# Patient Record
Sex: Male | Born: 1965 | Race: White | Hispanic: No | Marital: Married | Smoking: Former smoker
Health system: Southern US, Community
[De-identification: ages and names within clinical notes are randomized; demographics above are authoritative.]

## PROBLEM LIST (undated history)

## (undated) DIAGNOSIS — M549 Dorsalgia, unspecified: Secondary | ICD-10-CM

## (undated) DIAGNOSIS — I491 Atrial premature depolarization: Secondary | ICD-10-CM

## (undated) DIAGNOSIS — I493 Ventricular premature depolarization: Secondary | ICD-10-CM

## (undated) DIAGNOSIS — I471 Supraventricular tachycardia, unspecified: Secondary | ICD-10-CM

## (undated) DIAGNOSIS — I2510 Atherosclerotic heart disease of native coronary artery without angina pectoris: Secondary | ICD-10-CM

## (undated) HISTORY — DX: Supraventricular tachycardia, unspecified: I47.10

## (undated) HISTORY — DX: Atherosclerotic heart disease of native coronary artery without angina pectoris: I25.10

## (undated) HISTORY — DX: Atrial premature depolarization: I49.1

## (undated) HISTORY — DX: Ventricular premature depolarization: I49.3

## (undated) HISTORY — PX: BACK SURGERY: SHX140

## (undated) HISTORY — DX: Supraventricular tachycardia: I47.1

---

## 1999-02-24 ENCOUNTER — Encounter: Payer: Self-pay | Admitting: *Deleted

## 1999-02-24 ENCOUNTER — Inpatient Hospital Stay (HOSPITAL_COMMUNITY): Admission: EM | Admit: 1999-02-24 | Discharge: 1999-02-26 | Payer: Self-pay | Admitting: Emergency Medicine

## 1999-02-25 ENCOUNTER — Encounter: Payer: Self-pay | Admitting: *Deleted

## 2007-04-19 ENCOUNTER — Ambulatory Visit: Payer: Self-pay

## 2007-12-17 ENCOUNTER — Ambulatory Visit (HOSPITAL_COMMUNITY): Admission: RE | Admit: 2007-12-17 | Discharge: 2007-12-18 | Payer: Self-pay | Admitting: Neurosurgery

## 2008-04-28 ENCOUNTER — Ambulatory Visit: Admission: RE | Admit: 2008-04-28 | Discharge: 2008-04-28 | Payer: Self-pay | Admitting: Family Medicine

## 2008-04-28 ENCOUNTER — Ambulatory Visit: Payer: Self-pay | Admitting: Vascular Surgery

## 2008-04-28 ENCOUNTER — Encounter (INDEPENDENT_AMBULATORY_CARE_PROVIDER_SITE_OTHER): Payer: Self-pay | Admitting: Family Medicine

## 2009-10-27 ENCOUNTER — Encounter: Admission: RE | Admit: 2009-10-27 | Discharge: 2009-10-27 | Payer: Self-pay | Admitting: Neurosurgery

## 2009-10-29 ENCOUNTER — Encounter: Admission: RE | Admit: 2009-10-29 | Discharge: 2009-10-29 | Payer: Self-pay | Admitting: Neurosurgery

## 2009-11-19 ENCOUNTER — Encounter: Admission: RE | Admit: 2009-11-19 | Discharge: 2009-11-19 | Payer: Self-pay | Admitting: Neurosurgery

## 2010-01-19 ENCOUNTER — Ambulatory Visit (HOSPITAL_COMMUNITY): Admission: RE | Admit: 2010-01-19 | Discharge: 2010-01-20 | Payer: Self-pay | Admitting: Neurosurgery

## 2010-03-20 ENCOUNTER — Encounter: Payer: Self-pay | Admitting: Family Medicine

## 2010-05-11 LAB — BASIC METABOLIC PANEL
BUN: 12 mg/dL (ref 6–23)
Chloride: 105 mEq/L (ref 96–112)
GFR calc non Af Amer: >60 mL/min (ref >60)
Glucose, Bld: 100 mg/dL — ABNORMAL HIGH (ref 70–99)
Potassium: 4.3 mEq/L (ref 3.5–5.1)
Sodium: 140 mEq/L (ref 135–145)

## 2010-05-11 LAB — CBC
HCT: 45.5 % (ref 39.0–52.0)
Hemoglobin: 16.3 g/dL (ref 13.0–17.0)
MCHC: 35.8 g/dL (ref 30.0–36.0)
MCV: 94.4 fL (ref 78.0–100.0)
RDW: 12.2 % (ref 11.5–15.5)

## 2010-05-11 LAB — SURGICAL PCR SCREEN: MRSA, PCR: NEGATIVE

## 2010-07-12 NOTE — H&P (Signed)
Marvin Velasquez, Marvin Velasquez NO.:  [id number]   MEDICAL RECORD NO.:  [id number]          PATIENT TYPE:  OIB   LOCATION:  3537                         FACILITY:  [facility]   PHYSICIAN:  Hilda Lias, M.D.   DATE OF BIRTH:  [birth year]   DATE OF ADMISSION:  12/17/2007  DATE OF DISCHARGE:                              HISTORY & PHYSICAL   HISTORY OF PRESENT ILLNESS:  Marvin Velasquez is a gentleman who was seen in my  office on several occasions because of neck pain with radiation to both  upper extremities, associated with a tingling sensation of  4-5.  X-ray  showed that he has had stenosis of the level of 5-6. He came back to see  me because he was not any better.  He came with his wife and he wanted  to proceed with surgery as soon as possible.   PAST MEDICAL HISTORY:  Negative.   MEDICATIONS:  Negative.   FAMILY HISTORY:  Both parents died of heart disease.   SOCIAL HISTORY:  He does not smoke.  He drinks socially.   REVIEW OF SYSTEMS:  Positive for neck pain and tingling sensation in  both hands.   PHYSICAL EXAMINATION:  HEAD, EYES, NOSE, AND THROAT:  Normal.  NECK:  He has had severe pain with mobilization.  LUNGS:  Clear.  HEART:  Sounds normal.  ABDOMEN:  Normal.  EXTREMITIES:  Normal pulse.  NEUROLOGIC:  Weakness of both biceps __________extension, weakness is  symmetrical.   Cervical MRI show stenosis  at the level of 5-6 with bilateral  radiculopathy.   CLINICAL IMPRESSION:  C5-C6 stenosis with bilateral C6 radiculopathy.   RECOMMENDATIONS:  The patient is being admitted for surgery.  The  procedure will be C5-C6 diskectomy and fusion.  The patient knows about  the risks such as infection, CSF leak, no healing, need of further  surgery, spinal cord damage, or stroke.           ______________________________  Hilda Lias, M.D.     EB/MEDQ  D:  12/17/2007  T:  12/18/2007  Job:  [id number]

## 2010-07-12 NOTE — Op Note (Signed)
Marvin Velasquez, Marvin Velasquez                  ACCOUNT NO.:  [id number]   MEDICAL RECORD NO.:  [id number]          PATIENT TYPE:  OIB   LOCATION:  3537                         FACILITY:  [facility]   PHYSICIAN:  Hilda Lias, M.D.   DATE OF BIRTH:  Nov 30, 1965   DATE OF PROCEDURE:  DATE OF DISCHARGE:                               OPERATIVE REPORT   PREOPERATIVE DIAGNOSIS:  C5-C6 stenosis with lumbar radiculopathy.   POSTOPERATIVE DIAGNOSIS:  C5-C6 stenosis with lumbar radiculopathy.   PROCEDURE:  Anterior 5-6 diskectomy, interbody fusion with auto and  allograft plate microscope.   SURGEON:  Hilda Lias, MD   ASSISTANT:  Coletta Memos, MD.   CLINICAL HISTORY:  Marvin Velasquez is a 45 year old gentleman complaining of  neck pain, tingling sensation, weakness in both upper extremities.  X-  ray shows severe stenosis of 5-6.  Surgery was advised.   PROCEDURE:  The patient was taken to the OR and after intubation, the  left side of the neck was cleaned with DuraPrep.  Transverse incision  was made through the skin and subcutaneous tissue down to the cervical  spine.  We found a light osteophyte which was removed.  X-ray showed  indeed that was 5-6.  Then, we brought the microscope into the area.  The disk space was quite narrow, almost with no disk whatsoever.  We had  to drill our way until the posterior aspect of vertebral body.  Then,  the anterior ligament which was calcified was removed after incision was  made in the midline.  Having done this, we decompressed the spinal cord,  removing part of the bone of the 5-6.  It was quite narrowed.  The right  side was worse than the left one.  At the end, we had good decompression  of not only the spinal cord but both C6 nerve root.  Then the endplate  was drilled.  Allograft of 8 mm with autograft inside was inserted  followed by a plate using 4 screws.  Lateral cervical spine showed good  position of the graft and the plate.  The area was irrigated.   Once we  achieved hemostasis, the wound was closed with Vicryl and Steri-Strips.           ______________________________  Hilda Lias, M.D.     EB/MEDQ  D:  12/17/2007  T:  12/18/2007  Job:  [id number]

## 2010-11-29 LAB — CBC
HCT: 45.5
Hemoglobin: 15.6
MCV: 96.1
WBC: 6.3

## 2015-08-16 ENCOUNTER — Ambulatory Visit
Admission: RE | Admit: 2015-08-16 | Discharge: 2015-08-16 | Disposition: A | Discharge status: Home / Self Care | Payer: BC Managed Care – PPO | Source: Ambulatory Visit | Attending: Family Medicine | Admitting: Family Medicine

## 2015-08-16 ENCOUNTER — Other Ambulatory Visit: Payer: Self-pay | Admitting: Family Medicine

## 2015-08-16 DIAGNOSIS — M25511 Pain in right shoulder: Secondary | ICD-10-CM

## 2015-08-17 ENCOUNTER — Other Ambulatory Visit: Payer: Self-pay | Admitting: Family Medicine

## 2015-08-17 ENCOUNTER — Ambulatory Visit
Admission: RE | Admit: 2015-08-17 | Discharge: 2015-08-17 | Disposition: A | Discharge status: Home / Self Care | Payer: BC Managed Care – PPO | Source: Ambulatory Visit | Attending: Family Medicine | Admitting: Family Medicine

## 2015-08-17 DIAGNOSIS — M25551 Pain in right hip: Secondary | ICD-10-CM

## 2016-09-06 IMAGING — CR DG HIP (WITH OR WITHOUT PELVIS) 2-3V*R*
2 series · 2 of 2 positions shown · non-contrast
Comparison: None.

CLINICAL DATA: Right hip and buttock pain after sitting

EXAM:
DG HIP (WITH OR WITHOUT PELVIS) 2-3V RIGHT

[w hip ap right]
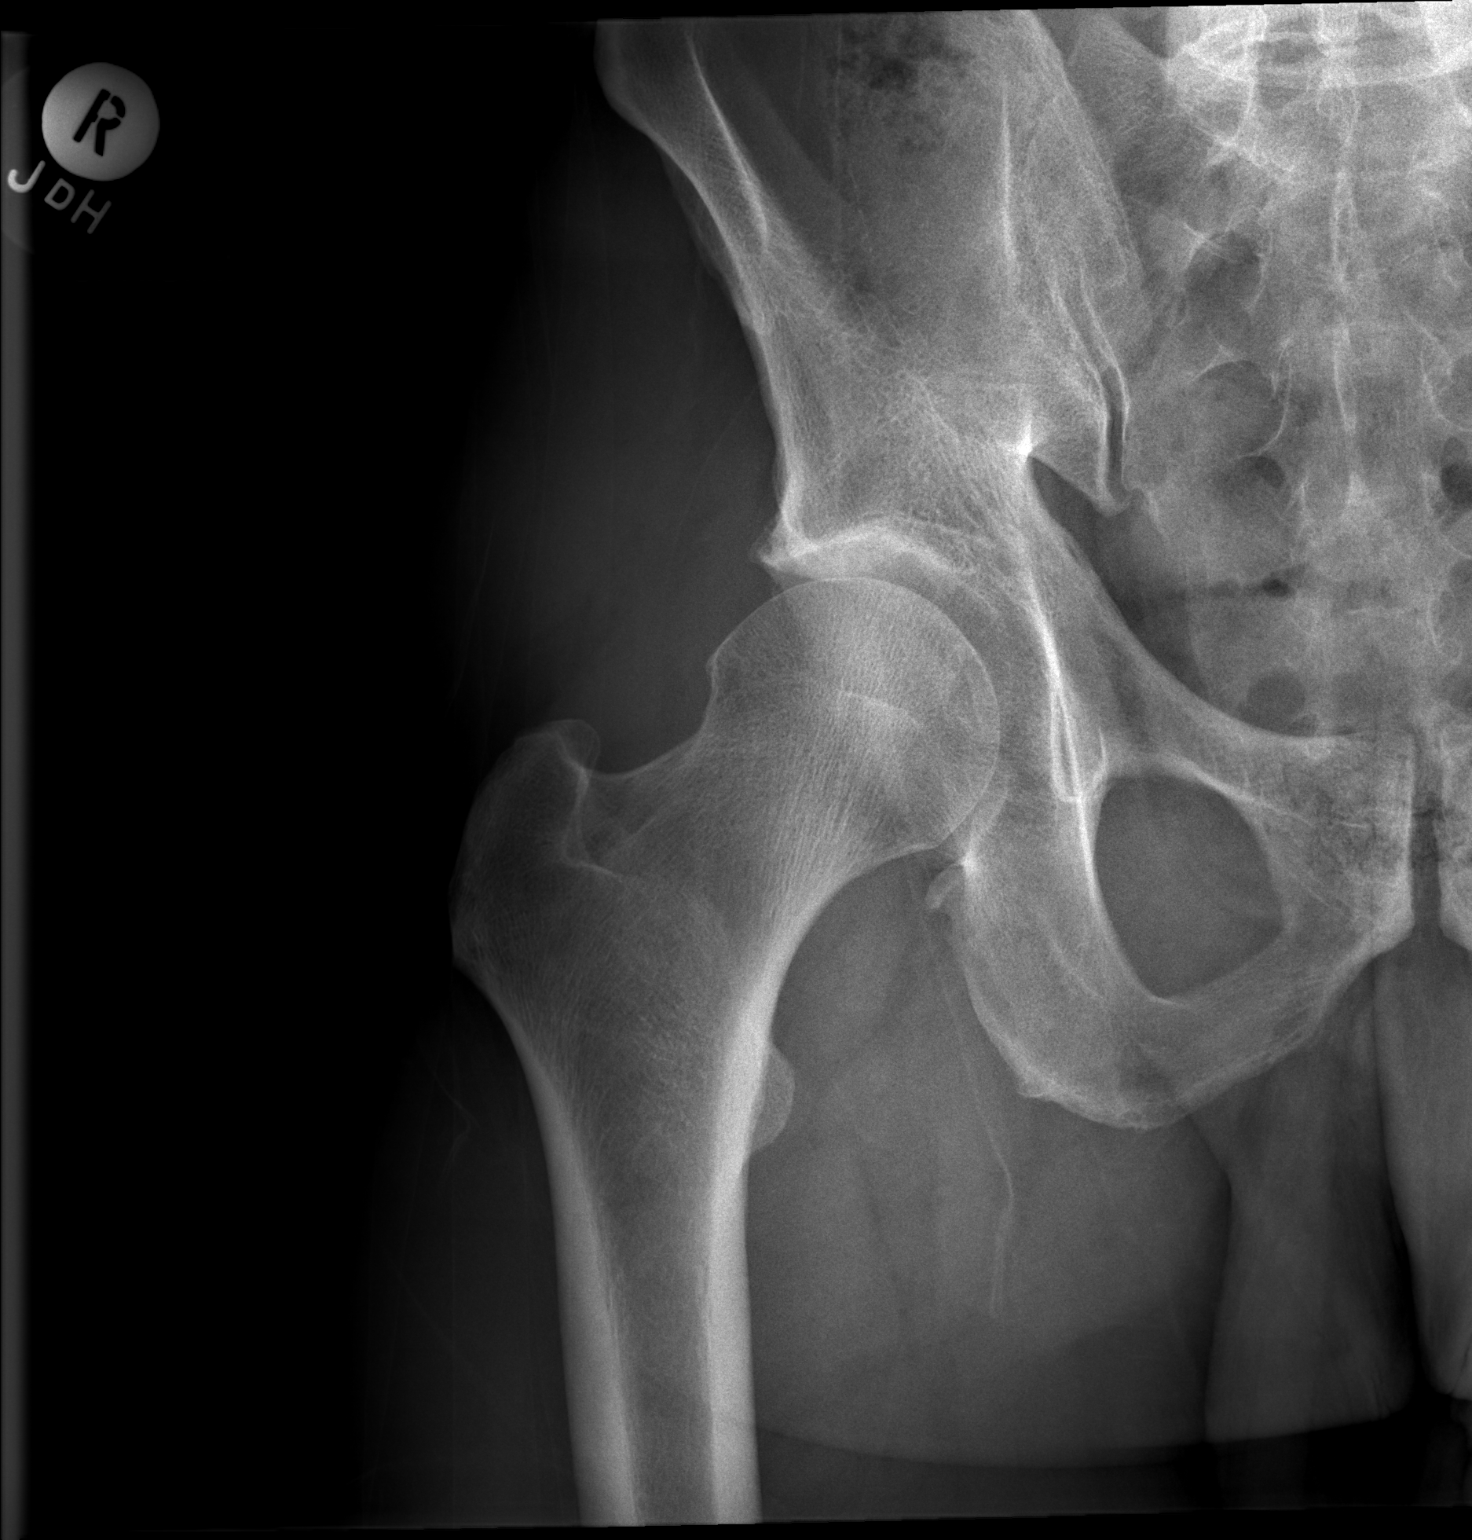

[w hip frog right]
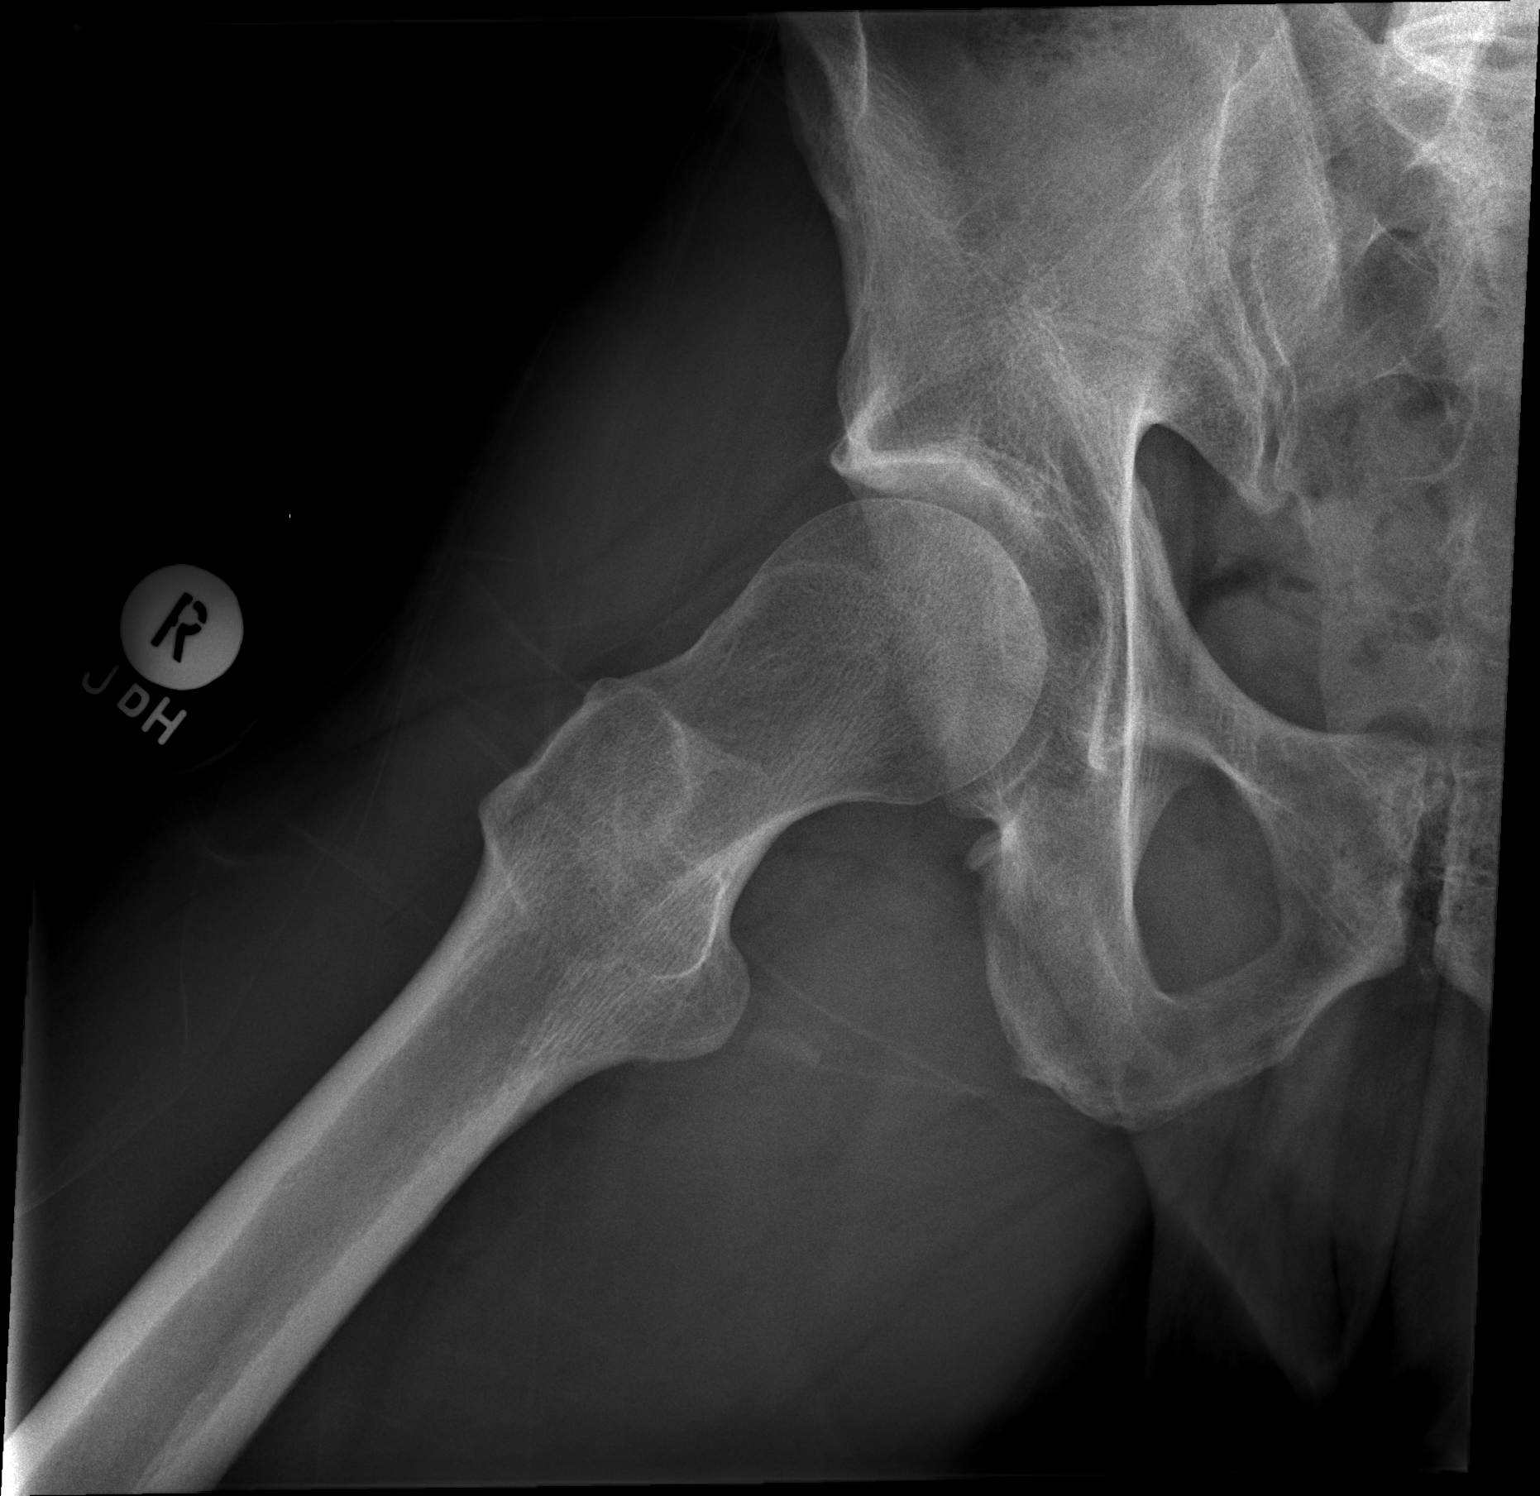

[2 of 2 positions shown; findings below may reference images not displayed]

FINDINGS: The right hip joint space appears normal. No significant
degenerative change is seen. No fracture is noted. The right pelvic
ramus is intact. The right SI joint appears corticated.
IMPRESSION: Negative.

## 2018-09-25 DIAGNOSIS — Z13220 Encounter for screening for lipoid disorders: Secondary | ICD-10-CM | POA: Diagnosis not present

## 2018-09-25 DIAGNOSIS — D367 Benign neoplasm of other specified sites: Secondary | ICD-10-CM | POA: Diagnosis not present

## 2018-09-25 DIAGNOSIS — Z8249 Family history of ischemic heart disease and other diseases of the circulatory system: Secondary | ICD-10-CM | POA: Diagnosis not present

## 2018-09-25 DIAGNOSIS — M7712 Lateral epicondylitis, left elbow: Secondary | ICD-10-CM | POA: Diagnosis not present

## 2018-09-25 DIAGNOSIS — Z0000 Encounter for general adult medical examination without abnormal findings: Secondary | ICD-10-CM | POA: Diagnosis not present

## 2018-09-25 DIAGNOSIS — C189 Malignant neoplasm of colon, unspecified: Secondary | ICD-10-CM | POA: Diagnosis not present

## 2018-09-25 DIAGNOSIS — R079 Chest pain, unspecified: Secondary | ICD-10-CM | POA: Diagnosis not present

## 2018-09-25 DIAGNOSIS — R5382 Chronic fatigue, unspecified: Secondary | ICD-10-CM | POA: Diagnosis not present

## 2018-09-25 DIAGNOSIS — Z1329 Encounter for screening for other suspected endocrine disorder: Secondary | ICD-10-CM | POA: Diagnosis not present

## 2018-09-27 DIAGNOSIS — M79601 Pain in right arm: Secondary | ICD-10-CM | POA: Diagnosis not present

## 2018-09-27 DIAGNOSIS — R079 Chest pain, unspecified: Secondary | ICD-10-CM | POA: Diagnosis not present

## 2018-09-27 DIAGNOSIS — C189 Malignant neoplasm of colon, unspecified: Secondary | ICD-10-CM | POA: Diagnosis not present

## 2018-09-27 DIAGNOSIS — M79602 Pain in left arm: Secondary | ICD-10-CM | POA: Diagnosis not present

## 2018-10-01 DIAGNOSIS — M79602 Pain in left arm: Secondary | ICD-10-CM | POA: Diagnosis not present

## 2018-10-01 DIAGNOSIS — M79601 Pain in right arm: Secondary | ICD-10-CM | POA: Diagnosis not present

## 2018-10-01 DIAGNOSIS — R079 Chest pain, unspecified: Secondary | ICD-10-CM | POA: Diagnosis not present

## 2018-10-04 DIAGNOSIS — R7989 Other specified abnormal findings of blood chemistry: Secondary | ICD-10-CM | POA: Diagnosis not present

## 2018-10-04 DIAGNOSIS — R079 Chest pain, unspecified: Secondary | ICD-10-CM | POA: Diagnosis not present

## 2018-10-15 DIAGNOSIS — I517 Cardiomegaly: Secondary | ICD-10-CM | POA: Diagnosis not present

## 2018-11-11 DIAGNOSIS — Z86010 Personal history of colonic polyps: Secondary | ICD-10-CM | POA: Diagnosis not present

## 2018-11-19 DIAGNOSIS — C189 Malignant neoplasm of colon, unspecified: Secondary | ICD-10-CM | POA: Diagnosis not present

## 2018-11-19 DIAGNOSIS — R079 Chest pain, unspecified: Secondary | ICD-10-CM | POA: Diagnosis not present

## 2019-05-02 ENCOUNTER — Ambulatory Visit: Payer: Self-pay | Attending: Internal Medicine

## 2019-05-02 DIAGNOSIS — Z23 Encounter for immunization: Secondary | ICD-10-CM | POA: Insufficient documentation

## 2019-05-02 NOTE — Progress Notes (Signed)
   Covid-19 Vaccination Clinic  Name:  Marvin Velasquez    MRN: [id number] DOB: 1965-03-30  05/02/2019  Mr. Marvin Velasquez was observed post Covid-19 immunization for 15 minutes without incident. He was provided with Vaccine Information Sheet and instruction to access the V-Safe system.   Mr. Marvin Velasquez was instructed to call 911 with any severe reactions post vaccine: Marland Kitchen Difficulty breathing  . Swelling of face and throat  . A fast heartbeat  . A bad rash all over body  . Dizziness and weakness   Immunizations Administered    Name Date Dose VIS Date Route   Pfizer COVID-19 Vaccine 05/02/2019 11:25 AM 0.3 mL 02/07/2019 Intramuscular   Manufacturer: [employer], [employer]   Lot: [id number]   NDC: [address]-4

## 2019-05-28 ENCOUNTER — Ambulatory Visit: Payer: Self-pay | Attending: Internal Medicine

## 2019-05-28 DIAGNOSIS — Z23 Encounter for immunization: Secondary | ICD-10-CM

## 2019-05-28 NOTE — Progress Notes (Signed)
   Covid-19 Vaccination Clinic  Name:  Marvin Velasquez    MRN: [id number] DOB: 15-Feb-1966  05/28/2019  Mr. Marvin Velasquez was observed post Covid-19 immunization for 15 minutes without incident. He was provided with Vaccine Information Sheet and instruction to access the V-Safe system.   Mr. Marvin Velasquez was instructed to call 911 with any severe reactions post vaccine: Marland Kitchen Difficulty breathing  . Swelling of face and throat  . A fast heartbeat  . A bad rash all over body  . Dizziness and weakness   Immunizations Administered    Name Date Dose VIS Date Route   Pfizer COVID-19 Vaccine 05/28/2019 11:25 AM 0.3 mL 02/07/2019 Intramuscular   Manufacturer: [employer], [employer]   Lot: [id number]   NDC: [address]-2

## 2019-12-25 ENCOUNTER — Inpatient Hospital Stay (HOSPITAL_COMMUNITY)
Admission: EM | Admit: 2019-12-25 | Discharge: 2019-12-28 | DRG: 247 | Disposition: A | Discharge status: Home / Self Care | Payer: BC Managed Care – PPO | Attending: Cardiovascular Disease | Admitting: Cardiovascular Disease

## 2019-12-25 ENCOUNTER — Inpatient Hospital Stay (HOSPITAL_COMMUNITY): Payer: BC Managed Care – PPO

## 2019-12-25 ENCOUNTER — Inpatient Hospital Stay (HOSPITAL_COMMUNITY)
Admission: EM | Disposition: A | Discharge status: Home / Self Care | Payer: Self-pay | Source: Home / Self Care | Attending: Cardiovascular Disease

## 2019-12-25 ENCOUNTER — Encounter (HOSPITAL_COMMUNITY): Payer: Self-pay | Admitting: Cardiovascular Disease

## 2019-12-25 ENCOUNTER — Emergency Department (HOSPITAL_COMMUNITY): Payer: BC Managed Care – PPO

## 2019-12-25 DIAGNOSIS — R079 Chest pain, unspecified: Secondary | ICD-10-CM | POA: Diagnosis not present

## 2019-12-25 DIAGNOSIS — R0789 Other chest pain: Secondary | ICD-10-CM | POA: Insufficient documentation

## 2019-12-25 DIAGNOSIS — Z20822 Contact with and (suspected) exposure to covid-19: Secondary | ICD-10-CM | POA: Diagnosis not present

## 2019-12-25 DIAGNOSIS — Z8249 Family history of ischemic heart disease and other diseases of the circulatory system: Secondary | ICD-10-CM

## 2019-12-25 DIAGNOSIS — I25110 Atherosclerotic heart disease of native coronary artery with unstable angina pectoris: Secondary | ICD-10-CM

## 2019-12-25 DIAGNOSIS — E7800 Pure hypercholesterolemia, unspecified: Secondary | ICD-10-CM | POA: Diagnosis present

## 2019-12-25 DIAGNOSIS — R001 Bradycardia, unspecified: Secondary | ICD-10-CM | POA: Diagnosis not present

## 2019-12-25 DIAGNOSIS — Z23 Encounter for immunization: Secondary | ICD-10-CM | POA: Diagnosis not present

## 2019-12-25 DIAGNOSIS — R21 Rash and other nonspecific skin eruption: Secondary | ICD-10-CM | POA: Diagnosis not present

## 2019-12-25 DIAGNOSIS — R072 Precordial pain: Secondary | ICD-10-CM | POA: Diagnosis not present

## 2019-12-25 DIAGNOSIS — I213 ST elevation (STEMI) myocardial infarction of unspecified site: Secondary | ICD-10-CM | POA: Diagnosis not present

## 2019-12-25 DIAGNOSIS — I2119 ST elevation (STEMI) myocardial infarction involving other coronary artery of inferior wall: Principal | ICD-10-CM

## 2019-12-25 DIAGNOSIS — I2111 ST elevation (STEMI) myocardial infarction involving right coronary artery: Secondary | ICD-10-CM | POA: Diagnosis not present

## 2019-12-25 DIAGNOSIS — Z955 Presence of coronary angioplasty implant and graft: Secondary | ICD-10-CM

## 2019-12-25 DIAGNOSIS — I25119 Atherosclerotic heart disease of native coronary artery with unspecified angina pectoris: Secondary | ICD-10-CM

## 2019-12-25 DIAGNOSIS — I2510 Atherosclerotic heart disease of native coronary artery without angina pectoris: Secondary | ICD-10-CM

## 2019-12-25 DIAGNOSIS — E785 Hyperlipidemia, unspecified: Secondary | ICD-10-CM | POA: Diagnosis not present

## 2019-12-25 HISTORY — DX: Dorsalgia, unspecified: M54.9

## 2019-12-25 HISTORY — PX: CORONARY/GRAFT ACUTE MI REVASCULARIZATION: CATH118305

## 2019-12-25 LAB — LIPID PANEL
Cholesterol: 182 mg/dL (ref 0–200)
HDL: 34 mg/dL — ABNORMAL LOW (ref >40)
LDL Cholesterol: 115 mg/dL — ABNORMAL HIGH (ref 0–99)
Total CHOL/HDL Ratio: 5.4 RATIO
Triglycerides: 164 mg/dL — ABNORMAL HIGH (ref <150)
VLDL: 33 mg/dL (ref 0–40)

## 2019-12-25 LAB — COMPREHENSIVE METABOLIC PANEL
ALT: 23 U/L (ref 0–44)
AST: 28 U/L (ref 15–41)
Albumin: 3.6 g/dL (ref 3.5–5.0)
Alkaline Phosphatase: 50 U/L (ref 38–126)
Anion gap: 9 (ref 5–15)
BUN: 21 mg/dL — ABNORMAL HIGH (ref 6–20)
CO2: 22 mmol/L (ref 22–32)
Calcium: 7.9 mg/dL — ABNORMAL LOW (ref 8.9–10.3)
Chloride: 97 mmol/L — ABNORMAL LOW (ref 98–111)
Creatinine, Ser: 0.83 mg/dL (ref 0.61–1.24)
GFR, Estimated: >60 mL/min (ref >60)
Glucose, Bld: 147 mg/dL — ABNORMAL HIGH (ref 70–99)
Potassium: 3.3 mmol/L — ABNORMAL LOW (ref 3.5–5.1)
Sodium: 128 mmol/L — ABNORMAL LOW (ref 135–145)
Total Bilirubin: 0.6 mg/dL (ref 0.3–1.2)
Total Protein: 5.9 g/dL — ABNORMAL LOW (ref 6.5–8.1)

## 2019-12-25 LAB — CBC WITH DIFFERENTIAL/PLATELET
Abs Immature Granulocytes: 0.03 10*3/uL (ref 0.00–0.07)
Basophils Absolute: 0 10*3/uL (ref 0.0–0.1)
Basophils Relative: 1 %
Eosinophils Absolute: 0.1 10*3/uL (ref 0.0–0.5)
Eosinophils Relative: 1 %
HCT: 40.3 % (ref 39.0–52.0)
Hemoglobin: 14.3 g/dL (ref 13.0–17.0)
Immature Granulocytes: 0 %
Lymphocytes Relative: 11 %
Lymphs Abs: 1 10*3/uL (ref 0.7–4.0)
MCH: 32.4 pg (ref 26.0–34.0)
MCHC: 35.5 g/dL (ref 30.0–36.0)
MCV: 91.4 fL (ref 80.0–100.0)
Monocytes Absolute: 0.7 10*3/uL (ref 0.1–1.0)
Monocytes Relative: 7 %
Neutro Abs: 7 10*3/uL (ref 1.7–7.7)
Neutrophils Relative %: 80 %
Platelets: 185 10*3/uL (ref 150–400)
RBC: 4.41 MIL/uL (ref 4.22–5.81)
RDW: 11.9 % (ref 11.5–15.5)
WBC: 8.8 10*3/uL (ref 4.0–10.5)
nRBC: 0 % (ref 0.0–0.2)

## 2019-12-25 LAB — TROPONIN I (HIGH SENSITIVITY)
Troponin I (High Sensitivity): 15 ng/L (ref <18)
Troponin I (High Sensitivity): 9333 ng/L (ref <18)
Troponin I (High Sensitivity): 24320 ng/L (ref <18)

## 2019-12-25 LAB — RESPIRATORY PANEL BY RT PCR (FLU A&B, COVID)
Influenza A by PCR: NEGATIVE
Influenza B by PCR: NEGATIVE
SARS Coronavirus 2 by RT PCR: NEGATIVE

## 2019-12-25 LAB — POCT ACTIVATED CLOTTING TIME: Activated Clotting Time: 120 seconds

## 2019-12-25 LAB — MRSA PCR SCREENING: MRSA by PCR: NEGATIVE

## 2019-12-25 LAB — PROTIME-INR
INR: 1.6 — ABNORMAL HIGH (ref 0.8–1.2)
Prothrombin Time: 18.1 seconds — ABNORMAL HIGH (ref 11.4–15.2)

## 2019-12-25 SURGERY — CORONARY/GRAFT ACUTE MI REVASCULARIZATION
Anesthesia: LOCAL

## 2019-12-25 MED ORDER — NITROGLYCERIN 1 MG/10 ML FOR IR/CATH LAB
INTRA_ARTERIAL | Status: AC
  Filled 2019-12-25: qty 10

## 2019-12-25 MED ORDER — HYDRALAZINE HCL 20 MG/ML IJ SOLN: 10.0000 mg | INTRAMUSCULAR | Status: AC | PRN

## 2019-12-25 MED ORDER — VERAPAMIL HCL 2.5 MG/ML IV SOLN: INTRAVENOUS | Status: DC | PRN

## 2019-12-25 MED ORDER — HEPARIN SODIUM (PORCINE) 1000 UNIT/ML IJ SOLN
INTRAMUSCULAR | Status: DC | PRN
  Administered 2019-12-25: 6000 [IU] via INTRAVENOUS

## 2019-12-25 MED ORDER — ATROPINE SULFATE 1 MG/10ML IJ SOSY
PREFILLED_SYRINGE | INTRAMUSCULAR | Status: AC
  Filled 2019-12-25: qty 10

## 2019-12-25 MED ORDER — MIDAZOLAM HCL 2 MG/2ML IJ SOLN
INTRAMUSCULAR | Status: DC | PRN
  Administered 2019-12-25: 2 mg via INTRAVENOUS

## 2019-12-25 MED ORDER — MORPHINE SULFATE (PF) 4 MG/ML IV SOLN: 4.0000 mg | Freq: Once | INTRAVENOUS | Status: DC

## 2019-12-25 MED ORDER — LIDOCAINE HCL (PF) 1 % IJ SOLN
INTRAMUSCULAR | Status: DC | PRN
  Administered 2019-12-25: 15 mL
  Administered 2019-12-25: 2 mL

## 2019-12-25 MED ORDER — SODIUM CHLORIDE 0.9% FLUSH: 3.0000 mL | INTRAVENOUS | Status: DC | PRN

## 2019-12-25 MED ORDER — ACETAMINOPHEN 325 MG PO TABS
650.0000 mg | ORAL_TABLET | ORAL | Status: DC | PRN
  Administered 2019-12-25 – 2019-12-28 (×5): 650 mg via ORAL
  Filled 2019-12-25 (×5): qty 2

## 2019-12-25 MED ORDER — HEPARIN (PORCINE) IN NACL 1000-0.9 UT/500ML-% IV SOLN
INTRAVENOUS | Status: DC | PRN
  Administered 2019-12-25 (×2): 500 mL

## 2019-12-25 MED ORDER — IOHEXOL 350 MG/ML SOLN
INTRAVENOUS | Status: DC | PRN
  Administered 2019-12-25 (×2): 90 mL

## 2019-12-25 MED ORDER — FENTANYL CITRATE (PF) 100 MCG/2ML IJ SOLN
INTRAMUSCULAR | Status: DC | PRN
Start: 2019-12-25 — End: 2019-12-25
  Administered 2019-12-25 (×2): 50 ug via INTRAVENOUS

## 2019-12-25 MED ORDER — TIROFIBAN (AGGRASTAT) BOLUS VIA INFUSION
INTRAVENOUS | Status: DC | PRN
  Administered 2019-12-25: 1870 ug via INTRAVENOUS

## 2019-12-25 MED ORDER — MORPHINE SULFATE (PF) 2 MG/ML IV SOLN: 2.0000 mg | INTRAVENOUS | Status: DC | PRN

## 2019-12-25 MED ORDER — ONDANSETRON HCL 4 MG/2ML IJ SOLN: 4.0000 mg | Freq: Once | INTRAMUSCULAR | Status: DC

## 2019-12-25 MED ORDER — ATROPINE SULFATE 1 MG/10ML IJ SOSY
PREFILLED_SYRINGE | INTRAMUSCULAR | Status: DC | PRN
  Administered 2019-12-25: 0.5 mg via INTRAVENOUS

## 2019-12-25 MED ORDER — TICAGRELOR 90 MG PO TABS
ORAL_TABLET | ORAL | Status: DC | PRN
  Administered 2019-12-25: 180 mg via ORAL

## 2019-12-25 MED ORDER — INFLUENZA VAC SPLIT QUAD 0.5 ML IM SUSY
0.5000 mL | PREFILLED_SYRINGE | INTRAMUSCULAR | Status: AC
  Administered 2019-12-27: 0.5 mL via INTRAMUSCULAR
  Filled 2019-12-25: qty 0.5

## 2019-12-25 MED ORDER — HEPARIN SODIUM (PORCINE) 5000 UNIT/ML IJ SOLN: 4000.0000 [IU] | Freq: Once | INTRAMUSCULAR | Status: DC

## 2019-12-25 MED ORDER — LABETALOL HCL 5 MG/ML IV SOLN: 10.0000 mg | INTRAVENOUS | Status: AC | PRN

## 2019-12-25 MED ORDER — TICAGRELOR 90 MG PO TABS
90.0000 mg | ORAL_TABLET | Freq: Two times a day (BID) | ORAL | Status: DC
  Administered 2019-12-26 – 2019-12-28 (×6): 90 mg via ORAL
  Filled 2019-12-25 (×6): qty 1

## 2019-12-25 MED ORDER — SODIUM CHLORIDE 0.9 % IV SOLN: INTRAVENOUS | Status: DC

## 2019-12-25 MED ORDER — POTASSIUM CHLORIDE 20 MEQ/15ML (10%) PO SOLN
40.0000 meq | Freq: Once | ORAL | Status: AC
  Administered 2019-12-25: 40 meq via ORAL
  Filled 2019-12-25: qty 30

## 2019-12-25 MED ORDER — SODIUM CHLORIDE 0.9 % IV SOLN: INTRAVENOUS | Status: AC

## 2019-12-25 MED ORDER — SODIUM CHLORIDE 0.9% FLUSH
3.0000 mL | Freq: Two times a day (BID) | INTRAVENOUS | Status: DC
  Administered 2019-12-25 – 2019-12-27 (×4): 3 mL via INTRAVENOUS

## 2019-12-25 MED ORDER — ASPIRIN 81 MG PO CHEW
81.0000 mg | CHEWABLE_TABLET | Freq: Every day | ORAL | Status: DC
  Administered 2019-12-26 – 2019-12-28 (×3): 81 mg via ORAL
  Filled 2019-12-25 (×3): qty 1

## 2019-12-25 MED ORDER — TIROFIBAN HCL IN NACL 5-0.9 MG/100ML-% IV SOLN: 0.1500 ug/kg/min | INTRAVENOUS | Status: AC

## 2019-12-25 MED ORDER — ONDANSETRON HCL 4 MG/2ML IJ SOLN: 4.0000 mg | Freq: Four times a day (QID) | INTRAMUSCULAR | Status: DC | PRN

## 2019-12-25 MED ORDER — OXYCODONE HCL 5 MG PO TABS
5.0000 mg | ORAL_TABLET | ORAL | Status: DC | PRN
  Administered 2019-12-26: 10 mg via ORAL
  Filled 2019-12-25: qty 2

## 2019-12-25 MED ORDER — ATORVASTATIN CALCIUM 80 MG PO TABS
80.0000 mg | ORAL_TABLET | Freq: Every day | ORAL | Status: DC
  Administered 2019-12-25 – 2019-12-27 (×3): 80 mg via ORAL
  Filled 2019-12-25 (×3): qty 1

## 2019-12-25 MED ORDER — SODIUM CHLORIDE 0.9 % IV SOLN: 250.0000 mL | INTRAVENOUS | Status: DC | PRN

## 2019-12-25 MED ORDER — TIROFIBAN HCL IN NACL 5-0.9 MG/100ML-% IV SOLN
INTRAVENOUS | Status: AC | PRN
  Administered 2019-12-25 (×2): 0.15 ug/kg/min via INTRAVENOUS

## 2019-12-25 SURGICAL SUPPLY — 18 items
BALLN SAPPHIRE 2.0X12 (BALLOONS) ×2
BALLN SAPPHIRE ~~LOC~~ 3.25X12 (BALLOONS) ×2 IMPLANT
BALLOON SAPPHIRE 2.0X12 (BALLOONS) ×1 IMPLANT
CATH INFINITI 5FR MULTPACK ANG (CATHETERS) ×2 IMPLANT
CATH LAUNCHER 6FR JR4 (CATHETERS) ×2 IMPLANT
GLIDESHEATH SLEND SS 6F .021 (SHEATH) ×2 IMPLANT
GUIDEWIRE INQWIRE 1.5J.035X260 (WIRE) ×1 IMPLANT
INQWIRE 1.5J .035X260CM (WIRE) ×2
KIT ENCORE 26 ADVANTAGE (KITS) ×2 IMPLANT
KIT HEART LEFT (KITS) ×2 IMPLANT
PACK CARDIAC CATHETERIZATION (CUSTOM PROCEDURE TRAY) ×2 IMPLANT
SHEATH PINNACLE 6F 10CM (SHEATH) ×2 IMPLANT
STENT SYNERGY XD 3.0X16 (Permanent Stent) ×1 IMPLANT
SYNERGY XD 3.0X16 (Permanent Stent) ×2 IMPLANT
TRANSDUCER W/STOPCOCK (MISCELLANEOUS) ×2 IMPLANT
TUBING CIL FLEX 10 FLL-RA (TUBING) ×2 IMPLANT
WIRE COUGAR XT STRL 190CM (WIRE) ×2 IMPLANT
WIRE EMERALD 3MM-J .035X150CM (WIRE) ×2 IMPLANT

## 2019-12-25 NOTE — Progress Notes (Signed)
Responded to code stemi. Pt in trauma B.  Going strait to [employer]. Called wife several times at [phone]. No answer.

## 2019-12-25 NOTE — H&P (Signed)
Cardiology Admission History and Physical:   Patient ID: Marvin Velasquez MRN: [id number]; DOB: 18-Aug-1965   Admission date: 12/25/2019  Primary Care Provider: Patient, No Pcp Per [facility] Cardiologist: No primary care provider on file.   Chief Complaint:  Chest pain   History of Present Illness:   Marvin Velasquez is a 54 yo male with no chronic medical problems who presented to the ED today via EMS with c/o severe substernal chest pain. He is diaphoretic upon arrival with ongoing chest pain. EKG with inferior ST elevation. Code STEMI activated.    Past Medical History:  Diagnosis Date  . Back pain     Past Surgical History:  Procedure Laterality Date  . BACK SURGERY       Medications Prior to Admission: Prior to Admission medications   Not on File  No home medications  Allergies:   Not on File  NKDA  Social History:   Social History   Socioeconomic History  . Marital status: Married    Spouse name: Not on file  . Number of children: Not on file  . Years of education: Not on file  . Highest education level: Not on file  Occupational History  . Not on file  Tobacco Use  . Smoking status: Not on file  Substance and Sexual Activity  . Alcohol use: Not on file  . Drug use: Not on file  . Sexual activity: Not on file  Other Topics Concern  . Not on file  Social History Narrative  . Not on file   Social Determinants of Health   Financial Resource Strain:   . Difficulty of Paying Living Expenses: Not on file  Food Insecurity:   . Worried About Programme researcher, broadcasting/film/video in the Last Year: Not on file  . Ran Out of Food in the Last Year: Not on file  Transportation Needs:   . Lack of Transportation (Medical): Not on file  . Lack of Transportation (Non-Medical): Not on file  Physical Activity:   . Days of Exercise per Week: Not on file  . Minutes of Exercise per Session: Not on file  Stress:   . Feeling of Stress : Not on file  Social Connections:   . Frequency of  Communication with Friends and Family: Not on file  . Frequency of Social Gatherings with Friends and Family: Not on file  . Attends Religious Services: Not on file  . Active Member of Clubs or Organizations: Not on file  . Attends Banker Meetings: Not on file  . Marital Status: Not on file  Intimate Partner Violence:   . Fear of Current or Ex-Partner: Not on file  . Emotionally Abused: Not on file  . Physically Abused: Not on file  . Sexually Abused: Not on file    Family History:  Family history of CAD The patient's family history is not on file.    ROS:  Please see the history of present illness.  All other ROS reviewed and negative.     Physical Exam/Data:   Vitals:   12/25/19 1458 12/25/19 1503 12/25/19 1508 12/25/19 1513  BP: 132/89 124/78 124/70 127/76  Pulse: 79 87 82 71  Resp: 17 19 (!) 27 17  SpO2: 100% 100% 100% (!) 0%  Weight:      Height:       No intake or output data in the 24 hours ending 12/25/19 1540 Last 3 Weights 12/25/2019 12/25/2019  Weight (lbs) 165 lb 165 lb  Weight (kg) 74.844 kg 74.844 kg     Body mass index is 24.37 kg/m.  General:  Well nourished, well developed, ill appearing HEENT: normal Lymph: no adenopathy Neck: no JVD Endocrine:  No thryomegaly Vascular: No carotid bruits; FA pulses 2+ bilaterally without bruits  Cardiac:  normal S1, S2; RRR; no murmur  Lungs:  clear to auscultation bilaterally, no wheezing, rhonchi or rales  Abd: soft, nontender, no hepatomegaly  Ext: no LE edema Musculoskeletal:  No deformities, BUE and BLE strength normal and equal Skin: warm and dry  Neuro:  CNs 2-12 intact, no focal abnormalities noted Psych:  Normal affect    EKG:  The ECG that was done was personally reviewed and demonstrates sinus rhythm with inferoposterior ST elevation.   Relevant CV Studies:   Laboratory Data:  High Sensitivity Troponin:   Recent Labs  Lab 12/25/19 1450  TROPONINIHS 15      Chemistry Recent  Labs  Lab 12/25/19 1450  NA 128*  K 3.3*  CL 97*  CO2 22  GLUCOSE 147*  BUN 21*  CREATININE 0.83  CALCIUM 7.9*  GFRNONAA >60  ANIONGAP 9    Recent Labs  Lab 12/25/19 1450  PROT 5.9*  ALBUMIN 3.6  AST 28  ALT 23  ALKPHOS 50  BILITOT 0.6   Hematology Recent Labs  Lab 12/25/19 1450  WBC 8.8  RBC 4.41  HGB 14.3  HCT 40.3  MCV 91.4  MCH 32.4  MCHC 35.5  RDW 11.9  PLT 185   BNPNo results for input(s): BNP, PROBNP in the last 168 hours.  DDimer No results for input(s): DDIMER in the last 168 hours.   Radiology/Studies:  CARDIAC CATHETERIZATION  Result Date: 12/25/2019  Mid RCA lesion is 100% stenosed.  Prox Cx to Mid Cx lesion is 20% stenosed.  Mid LAD lesion is 80% stenosed.  A drug-eluting stent was successfully placed using a SYNERGY XD 3.0X16.  Post intervention, there is a 0% residual stenosis.  1. Acute inferior STEMI secondary to thrombotic occlusion of the mid RCA 2. Successful PTCA/DES x 1 mid RCA 3. Severe stenosis mid LAD Recommendations: Continue Aggrastat for 2 hours. Will continue DAPT with ASA and Brilinta for one year. Will start a high intensity statin. Will start a beta blocker tomorrow if BP tolerates. Echo tomorrow. I will plan a staged PCI of the LAD tomorrow if he remains stable.     Assessment and Plan:   1. Acute inferior STEMI: Emergent cardiac cath with thrombotic occlusion of the mid RCA. Successful PTCA/DES x 1 mid RCA. Residual severe mid LAD stenosis. Will admit to the ICU. Continue Aggrastat for 2 hours. Continue DAPT with ASA and Brilinta for one year post MI. Start high intensity statin. Echo in the am.  -Staged PCI of the mid LAD tomorrow -Start beta blocker tomorrow if BP tolerates   Severity of Illness: The appropriate patient status for this patient is INPATIENT. Inpatient status is judged to be reasonable and necessary in order to provide the required intensity of service to ensure the patient's safety. The patient's  presenting symptoms, physical exam findings, and initial radiographic and laboratory data in the context of their chronic comorbidities is felt to place them at high risk for further clinical deterioration. Furthermore, it is not anticipated that the patient will be medically stable for discharge from the hospital within 2 midnights of admission. The following factors support the patient status of inpatient.   " The patient's presenting symptoms include chest pain. "  The worrisome physical exam findings include  " The initial radiographic and laboratory data are worrisome because of ST elevation on EKG " The chronic co-morbidities include none   * I certify that at the point of admission it is my clinical judgment that the patient will require inpatient hospital care spanning beyond 2 midnights from the point of admission due to high intensity of service, high risk for further deterioration and high frequency of surveillance required.*    For questions or updates, please contact [facility] Please consult [email or web address] for contact info under     Signed, Verne Carrow, MD  12/25/2019 3:40 PM \

## 2019-12-25 NOTE — Progress Notes (Signed)
CRITICAL VALUE ALERT  Critical Value: PTT.200  Date & Time Notied:  12/25/2019 1620  Result expected post procedure.

## 2019-12-25 NOTE — Progress Notes (Signed)
ACT 120. RN to remove sheath shortly. Site currently level 1 (some bruising and oozing but no palpable hematoma). Will continue to monitor closely.

## 2019-12-25 NOTE — Progress Notes (Addendum)
CRITICAL VALUE ALERT  Critical Value:  Troponin.5597  Date & Time Notied:  10/28 1711  Expected value post STEMI and Cath

## 2019-12-25 NOTE — ED Provider Notes (Signed)
Emergency Department Provider Note   I have reviewed the triage vital signs and the nursing notes.   HISTORY  Chief Complaint Code STEMI   HPI Marvin Velasquez is a 54 y.o. male presents to the emergency department by EMS as a code STEMI.  The EMS crew was unable to activate from the field but the code STEMI was activated immediately upon arrival in the ED.  Patient apparently began having central, substernal chest discomfort which was severe while at work.  He apparently had taken a "pre workout" and was running on the treadmill at the time.  He became diaphoretic and experienced mild shortness of breath.  EMS was called who identified a inferior STEMI on EKG. patient received aspirin prior to arrival.  No nitroglycerin given given the inferior location of ischemia.  Patient continues to have chest discomfort.  No prior history of ACS although does note a family history.  He does not smoke. Pain is radiating into both arms.     Past Medical History:  Diagnosis Date  . Back pain     Patient Active Problem List   Diagnosis Date Noted  . Acute ST elevation myocardial infarction (STEMI) of inferior wall (HCC)    Allergies Patient has no known allergies.  No family history on file.  Social History Social History   Tobacco Use  . Smoking status: Not on file  Substance Use Topics  . Alcohol use: Not on file  . Drug use: Not on file    Review of Systems  Constitutional: No fever/chills Eyes: No visual changes. ENT: No sore throat. Cardiovascular: Positive chest pain and diaphoresis.  Respiratory: Denies shortness of breath. Gastrointestinal: No abdominal pain. Positive nausea, no vomiting.  No diarrhea.  No constipation. Genitourinary: Negative for dysuria. Musculoskeletal: Negative for back pain. Skin: Negative for rash. Neurological: Negative for headaches, focal weakness or numbness.  10-point ROS otherwise  negative.  ____________________________________________   PHYSICAL EXAM:  VITALS: Vitals:  BP: 117/69   Pulse: (!) 50   Resp: 15   Temp:  98.4 F (36.9 C)  SpO2: 97%     Constitutional: Alert and oriented. Patient slightly diaphoretic and appearing unwell.  Eyes: Conjunctivae are normal.  Head: Atraumatic. Nose: No congestion/rhinnorhea. Mouth/Throat: Mucous membranes are moist.  Neck: No stridor.   Cardiovascular: Normal rate, regular rhythm. Good peripheral circulation. Grossly normal heart sounds.   Respiratory: Normal respiratory effort.  No retractions. Lungs CTAB. Gastrointestinal: Soft and nontender. No distention.  Musculoskeletal: No gross deformities of extremities. Neurologic:  Normal speech and language.  Skin:  Skin is warm, dry and intact. No rash noted.  ____________________________________________   LABS (all labs ordered are listed, but only abnormal results are displayed)  Labs Reviewed  PROTIME-INR - Abnormal; Notable for the following components:      Result Value   Prothrombin Time 18.1 (*)    INR 1.6 (*)    All other components within normal limits  APTT - Abnormal; Notable for the following components:   aPTT >200 (*)    All other components within normal limits  COMPREHENSIVE METABOLIC PANEL - Abnormal; Notable for the following components:   Sodium 128 (*)    Potassium 3.3 (*)    Chloride 97 (*)    Glucose, Bld 147 (*)    BUN 21 (*)    Calcium 7.9 (*)    Total Protein 5.9 (*)    All other components within normal limits  LIPID PANEL - Abnormal; Notable for  the following components:   Triglycerides 164 (*)    HDL 34 (*)    LDL Cholesterol 115 (*)    All other components within normal limits  BASIC METABOLIC PANEL - Abnormal; Notable for the following components:   Glucose, Bld 134 (*)    Calcium 8.6 (*)    All other components within normal limits  HEPATIC FUNCTION PANEL - Abnormal; Notable for the following components:   Total  Protein 5.6 (*)    Albumin 3.3 (*)    AST 112 (*)    Bilirubin, Direct 0.3 (*)    All other components within normal limits  TROPONIN I (HIGH SENSITIVITY) - Abnormal; Notable for the following components:   Troponin I (High Sensitivity) 9,333 (*)    All other components within normal limits  TROPONIN I (HIGH SENSITIVITY) - Abnormal; Notable for the following components:   Troponin I (High Sensitivity) 24,320 (*)    All other components within normal limits  RESPIRATORY PANEL BY RT PCR (FLU A&B, COVID)  MRSA PCR SCREENING  RESPIRATORY PANEL BY RT PCR (FLU A&B, COVID)  HEMOGLOBIN A1C  CBC WITH DIFFERENTIAL/PLATELET  CBC  POCT ACTIVATED CLOTTING TIME  TROPONIN I (HIGH SENSITIVITY)  TROPONIN I (HIGH SENSITIVITY)   ____________________________________________  EKG   EKG Interpretation  Date/Time:  Thursday December 25 2019 14:16:57 EDT Ventricular Rate:  58 PR Interval:    QRS Duration: 96 QT Interval:  405 QTC Calculation: 398 R Axis:   88 Text Interpretation: Sinus rhythm Inferoposterior infarct, acute (RCA) Lateral infarct, acute Probable RV involvement, suggest recording right precordial leads ** ** ACUTE MI / STEMI ** ** Confirmed by Alona Bene [phone]) on 12/26/2019 9:42:32 AM       ____________________________________________  RADIOLOGY  CARDIAC CATHETERIZATION  Result Date: 12/25/2019  Mid RCA lesion is 100% stenosed.  Prox Cx to Mid Cx lesion is 20% stenosed.  Mid LAD lesion is 80% stenosed.  A drug-eluting stent was successfully placed using a SYNERGY XD 3.0X16.  Post intervention, there is a 0% residual stenosis.  1. Acute inferior STEMI secondary to thrombotic occlusion of the mid RCA 2. Successful PTCA/DES x 1 mid RCA 3. Severe stenosis mid LAD Recommendations: Continue Aggrastat for 2 hours. Will continue DAPT with ASA and Brilinta for one year. Will start a high intensity statin. Will start a beta blocker tomorrow if BP tolerates. Echo tomorrow. I will  plan a staged PCI of the LAD tomorrow if he remains stable.   DG Chest Port 1 View  Result Date: 12/25/2019 CLINICAL DATA:  54 year old male with chest pain. EXAM: PORTABLE CHEST 1 VIEW COMPARISON:  None. FINDINGS: No focal consolidation, pleural effusion, pneumothorax. The cardiac silhouette is within limits. Atherosclerotic calcification of the aorta. No acute osseous pathology. Partially visualized lower cervical ACDF. IMPRESSION: No active disease. Electronically Signed   By: Elgie Collard M.D.   On: 12/25/2019 18:05    ____________________________________________   PROCEDURES  Procedure(s) performed:   Procedures  CRITICAL CARE Performed by: Maia Plan Total critical care time: 20 minutes Critical care time was exclusive of separately billable procedures and treating other patients. Critical care was necessary to treat or prevent imminent or life-threatening deterioration. Critical care was time spent personally by me on the following activities: development of treatment plan with patient and/or surrogate as well as nursing, discussions with consultants, evaluation of patient's response to treatment, examination of patient, obtaining history from patient or surrogate, ordering and performing treatments and interventions, ordering and review of  laboratory studies, ordering and review of radiographic studies, pulse oximetry and re-evaluation of patient's condition.  Alona BeneJoshua Tivis Wherry, MD Emergency Medicine  ____________________________________________   INITIAL IMPRESSION / ASSESSMENT AND PLAN / ED COURSE  Pertinent labs & imaging results that were available during my care of the patient were reviewed by me and considered in my medical decision making (see chart for details).   Patient presents to the emergency department activated as a code STEMI immediately upon arrival.  EKG interpreted as above showing acute ischemia inferiorly.  Dr. Bland SpanMcEleney came down to evaluate the patient  promptly in the ED and the decision was made to take him immediately to the Cath Lab.  Patient was given heparin along with morphine for pain.  Nitroglycerin avoided due to inferior location of the STEMI.  Patient remains awake and alert and hemodynamically stable while in the ED. CXR reviewed. Labs ordered and pending.    ____________________________________________  FINAL CLINICAL IMPRESSION(S) / ED DIAGNOSES  Final diagnoses:  ST elevation myocardial infarction (STEMI), unspecified artery (HCC)     MEDICATIONS GIVEN DURING THIS VISIT:  Medications  morphine 4 MG/ML injection 4 mg ( Intravenous MAR Unhold 12/25/19 1602)  ondansetron (ZOFRAN) injection 4 mg ( Intravenous MAR Unhold 12/25/19 1602)  0.9 %  sodium chloride infusion (has no administration in time range)  heparin injection 4,000 Units ( Intravenous MAR Unhold 12/25/19 1602)  labetalol (NORMODYNE) injection 10 mg (has no administration in time range)  hydrALAZINE (APRESOLINE) injection 10 mg (has no administration in time range)  acetaminophen (TYLENOL) tablet 650 mg (650 mg Oral Given 12/26/19 0238)  ondansetron (ZOFRAN) injection 4 mg (has no administration in time range)  0.9 %  sodium chloride infusion (0 mLs Intravenous Stopped 12/26/19 0235)  sodium chloride flush (NS) 0.9 % injection 3 mL (3 mLs Intravenous Given 12/26/19 0922)  sodium chloride flush (NS) 0.9 % injection 3 mL (has no administration in time range)  0.9 %  sodium chloride infusion (has no administration in time range)  oxyCODONE (Oxy IR/ROXICODONE) immediate release tablet 5-10 mg (has no administration in time range)  morphine 2 MG/ML injection 2 mg (has no administration in time range)  atorvastatin (LIPITOR) tablet 80 mg (80 mg Oral Given 12/25/19 1802)  aspirin chewable tablet 81 mg (81 mg Oral Given 12/26/19 0916)  ticagrelor (BRILINTA) tablet 90 mg (90 mg Oral Given 12/26/19 0917)  tirofiban (AGGRASTAT) infusion 50 mcg/mL 100 mL (0 mcg/kg/min   74.8 kg Intravenous Stopped 12/25/19 1808)  influenza vac split quadrivalent PF (FLUARIX) injection 0.5 mL (has no administration in time range)  atropine 1 MG/10ML injection (  Not Given 12/25/19 2112)  Chlorhexidine Gluconate Cloth 2 % PADS 6 each (6 each Topical Given 12/26/19 0545)  sodium chloride flush (NS) 0.9 % injection 3 mL (3 mLs Intravenous Given 12/26/19 0922)  sodium chloride flush (NS) 0.9 % injection 3 mL (has no administration in time range)  0.9 %  sodium chloride infusion (has no administration in time range)  0.9% sodium chloride infusion (3 mL/kg/hr  74.8 kg Intravenous New Bag/Given 12/26/19 0544)    Followed by  0.9% sodium chloride infusion (1 mL/kg/hr  74.8 kg Intravenous Rate/Dose Verify 12/26/19 0800)  pantoprazole (PROTONIX) EC tablet 40 mg (40 mg Oral Given 12/26/19 0917)  tirofiban (AGGRASTAT) infusion 50 mcg/mL 100 mL (0 mcg/kg/min  74.8 kg  Stopped 12/26/19 0319)  potassium chloride 20 MEQ/15ML (10%) solution 40 mEq (40 mEq Oral Given 12/25/19 1628)  aspirin chewable tablet 81 mg (81  mg Oral Given 12/26/19 0543)    Note:  This document was prepared using Dragon voice recognition software and may include unintentional dictation errors.  Alona Bene, MD, [facility] Emergency [facility]    Kuba Shepherd, Arlyss Repress, MD 12/26/19 [phone]

## 2019-12-25 NOTE — Progress Notes (Signed)
Right arterial femoral sheath removed per order. Manual pressure held for 15 minutes. Site is a level 1. Patient remained hemodynamically stable throughout pull.

## 2019-12-25 NOTE — ED Triage Notes (Addendum)
Pt arrives via ems as a stemi but due to type of ems service unable to activate in field, pt was activated as stemi upon arrival to ER and stemi team called. Pt arrives pale diaphoretic having active chest pain, EKG showing inferior MI. 324 mg asa PTA, no IV access on arrival. Pt went to a clinic at his work due to CP in center of chest radiating into left arm. His pain started 1 hour prior to going the clinic , he took a pre workout and was on a treadmill.

## 2019-12-26 ENCOUNTER — Encounter (HOSPITAL_COMMUNITY): Payer: Self-pay | Admitting: Cardiovascular Disease

## 2019-12-26 ENCOUNTER — Inpatient Hospital Stay (HOSPITAL_COMMUNITY): Payer: BC Managed Care – PPO

## 2019-12-26 ENCOUNTER — Other Ambulatory Visit: Payer: Self-pay

## 2019-12-26 ENCOUNTER — Inpatient Hospital Stay (HOSPITAL_COMMUNITY)
Admission: EM | Disposition: A | Discharge status: Home / Self Care | Payer: Self-pay | Source: Home / Self Care | Attending: Cardiovascular Disease

## 2019-12-26 DIAGNOSIS — I25110 Atherosclerotic heart disease of native coronary artery with unstable angina pectoris: Secondary | ICD-10-CM

## 2019-12-26 DIAGNOSIS — E7800 Pure hypercholesterolemia, unspecified: Secondary | ICD-10-CM

## 2019-12-26 DIAGNOSIS — I2510 Atherosclerotic heart disease of native coronary artery without angina pectoris: Secondary | ICD-10-CM

## 2019-12-26 DIAGNOSIS — I2119 ST elevation (STEMI) myocardial infarction involving other coronary artery of inferior wall: Secondary | ICD-10-CM | POA: Diagnosis not present

## 2019-12-26 DIAGNOSIS — I25119 Atherosclerotic heart disease of native coronary artery with unspecified angina pectoris: Secondary | ICD-10-CM

## 2019-12-26 HISTORY — PX: CORONARY STENT INTERVENTION: CATH118234

## 2019-12-26 LAB — HEPATIC FUNCTION PANEL
ALT: 31 U/L (ref 0–44)
AST: 112 U/L — ABNORMAL HIGH (ref 15–41)
Albumin: 3.3 g/dL — ABNORMAL LOW (ref 3.5–5.0)
Alkaline Phosphatase: 45 U/L (ref 38–126)
Bilirubin, Direct: 0.3 mg/dL — ABNORMAL HIGH (ref 0.0–0.2)
Indirect Bilirubin: 0.6 mg/dL (ref 0.3–0.9)
Total Bilirubin: 0.9 mg/dL (ref 0.3–1.2)
Total Protein: 5.6 g/dL — ABNORMAL LOW (ref 6.5–8.1)

## 2019-12-26 LAB — POCT I-STAT, CHEM 8
BUN: 23 mg/dL — ABNORMAL HIGH (ref 6–20)
Calcium, Ion: 1.15 mmol/L (ref 1.15–1.40)
Chloride: 94 mmol/L — ABNORMAL LOW (ref 98–111)
Creatinine, Ser: 0.6 mg/dL — ABNORMAL LOW (ref 0.61–1.24)
Glucose, Bld: 134 mg/dL — ABNORMAL HIGH (ref 70–99)
HCT: 39 % (ref 39.0–52.0)
Hemoglobin: 13.3 g/dL (ref 13.0–17.0)
Potassium: 3.4 mmol/L — ABNORMAL LOW (ref 3.5–5.1)
Sodium: 130 mmol/L — ABNORMAL LOW (ref 135–145)
TCO2: 22 mmol/L (ref 22–32)

## 2019-12-26 LAB — BASIC METABOLIC PANEL
Anion gap: 7 (ref 5–15)
BUN: 18 mg/dL (ref 6–20)
CO2: 23 mmol/L (ref 22–32)
Calcium: 8.6 mg/dL — ABNORMAL LOW (ref 8.9–10.3)
Chloride: 106 mmol/L (ref 98–111)
Creatinine, Ser: 1.06 mg/dL (ref 0.61–1.24)
GFR, Estimated: >60 mL/min (ref >60)
Glucose, Bld: 134 mg/dL — ABNORMAL HIGH (ref 70–99)
Potassium: 4.4 mmol/L (ref 3.5–5.1)
Sodium: 136 mmol/L (ref 135–145)

## 2019-12-26 LAB — POCT ACTIVATED CLOTTING TIME
Activated Clotting Time: 444 seconds
Activated Clotting Time: 406 seconds

## 2019-12-26 LAB — CBC
HCT: 41 % (ref 39.0–52.0)
Hemoglobin: 14.1 g/dL (ref 13.0–17.0)
MCH: 32.1 pg (ref 26.0–34.0)
MCHC: 34.4 g/dL (ref 30.0–36.0)
MCV: 93.4 fL (ref 80.0–100.0)
Platelets: 210 10*3/uL (ref 150–400)
RBC: 4.39 MIL/uL (ref 4.22–5.81)
RDW: 12.2 % (ref 11.5–15.5)
WBC: 8.2 10*3/uL (ref 4.0–10.5)
nRBC: 0 % (ref 0.0–0.2)

## 2019-12-26 LAB — ECHOCARDIOGRAM COMPLETE
Area-P 1/2: 2.62 cm2
Height: 69 in
S' Lateral: 3.5 cm
Weight: 2698.43 oz

## 2019-12-26 LAB — APTT: aPTT: >200 seconds (ref 24–36)

## 2019-12-26 LAB — HEMOGLOBIN A1C
Hgb A1c MFr Bld: 5.4 % (ref 4.8–5.6)
Mean Plasma Glucose: 108 mg/dL

## 2019-12-26 SURGERY — CORONARY STENT INTERVENTION
Anesthesia: LOCAL

## 2019-12-26 MED ORDER — SODIUM CHLORIDE 0.9 % WEIGHT BASED INFUSION
3.0000 mL/kg/h | INTRAVENOUS | Status: DC
  Administered 2019-12-26: 3 mL/kg/h via INTRAVENOUS

## 2019-12-26 MED ORDER — MIDAZOLAM HCL 2 MG/2ML IJ SOLN
INTRAMUSCULAR | Status: DC | PRN
  Administered 2019-12-26: 2 mg via INTRAVENOUS

## 2019-12-26 MED ORDER — CHLORHEXIDINE GLUCONATE CLOTH 2 % EX PADS
6.0000 | MEDICATED_PAD | Freq: Every day | CUTANEOUS | Status: DC
  Administered 2019-12-26: 6 via TOPICAL

## 2019-12-26 MED ORDER — SODIUM CHLORIDE 0.9 % IV SOLN: 250.0000 mL | INTRAVENOUS | Status: DC | PRN

## 2019-12-26 MED ORDER — LIDOCAINE HCL (PF) 1 % IJ SOLN
INTRAMUSCULAR | Status: AC
  Filled 2019-12-26: qty 30

## 2019-12-26 MED ORDER — HEPARIN SODIUM (PORCINE) 1000 UNIT/ML IJ SOLN
INTRAMUSCULAR | Status: AC
  Filled 2019-12-26: qty 1

## 2019-12-26 MED ORDER — ASPIRIN 81 MG PO CHEW
81.0000 mg | CHEWABLE_TABLET | ORAL | Status: AC
  Administered 2019-12-26: 81 mg via ORAL
  Filled 2019-12-26: qty 1

## 2019-12-26 MED ORDER — SODIUM CHLORIDE 0.9% FLUSH: 3.0000 mL | INTRAVENOUS | Status: DC | PRN

## 2019-12-26 MED ORDER — HEPARIN (PORCINE) IN NACL 1000-0.9 UT/500ML-% IV SOLN
INTRAVENOUS | Status: AC
  Filled 2019-12-26: qty 1000

## 2019-12-26 MED ORDER — NITROGLYCERIN 1 MG/10 ML FOR IR/CATH LAB
INTRA_ARTERIAL | Status: AC
  Filled 2019-12-26: qty 10

## 2019-12-26 MED ORDER — HEPARIN (PORCINE) IN NACL 1000-0.9 UT/500ML-% IV SOLN
INTRAVENOUS | Status: DC | PRN
  Administered 2019-12-26 (×2): 500 mL

## 2019-12-26 MED ORDER — SODIUM CHLORIDE 0.9% FLUSH
3.0000 mL | Freq: Two times a day (BID) | INTRAVENOUS | Status: DC
  Administered 2019-12-27 (×2): 3 mL via INTRAVENOUS

## 2019-12-26 MED ORDER — HYDRALAZINE HCL 20 MG/ML IJ SOLN: 10.0000 mg | INTRAMUSCULAR | Status: AC | PRN

## 2019-12-26 MED ORDER — IOHEXOL 350 MG/ML SOLN
INTRAVENOUS | Status: DC | PRN
  Administered 2019-12-26: 80 mL

## 2019-12-26 MED ORDER — SODIUM CHLORIDE 0.9 % IV SOLN: INTRAVENOUS | Status: AC

## 2019-12-26 MED ORDER — HEPARIN SODIUM (PORCINE) 1000 UNIT/ML IJ SOLN
INTRAMUSCULAR | Status: DC | PRN
  Administered 2019-12-26: 10000 [IU] via INTRAVENOUS

## 2019-12-26 MED ORDER — VERAPAMIL HCL 2.5 MG/ML IV SOLN
INTRAVENOUS | Status: DC | PRN
  Administered 2019-12-26: 10 mL via INTRA_ARTERIAL

## 2019-12-26 MED ORDER — SODIUM CHLORIDE 0.9 % WEIGHT BASED INFUSION
1.0000 mL/kg/h | INTRAVENOUS | Status: DC
  Administered 2019-12-26: 1 mL/kg/h via INTRAVENOUS

## 2019-12-26 MED ORDER — SODIUM CHLORIDE 0.9% FLUSH
3.0000 mL | Freq: Two times a day (BID) | INTRAVENOUS | Status: DC
  Administered 2019-12-26: 3 mL via INTRAVENOUS

## 2019-12-26 MED ORDER — FENTANYL CITRATE (PF) 100 MCG/2ML IJ SOLN
INTRAMUSCULAR | Status: DC | PRN
Start: 2019-12-26 — End: 2019-12-26
  Administered 2019-12-26 (×2): 50 ug via INTRAVENOUS

## 2019-12-26 MED ORDER — PANTOPRAZOLE SODIUM 40 MG PO TBEC
40.0000 mg | DELAYED_RELEASE_TABLET | Freq: Every day | ORAL | Status: DC
  Administered 2019-12-26 – 2019-12-28 (×3): 40 mg via ORAL
  Filled 2019-12-26 (×3): qty 1

## 2019-12-26 MED ORDER — FENTANYL CITRATE (PF) 100 MCG/2ML IJ SOLN
INTRAMUSCULAR | Status: AC
  Filled 2019-12-26: qty 2

## 2019-12-26 MED ORDER — MIDAZOLAM HCL 2 MG/2ML IJ SOLN
INTRAMUSCULAR | Status: AC
  Filled 2019-12-26: qty 2

## 2019-12-26 MED ORDER — LABETALOL HCL 5 MG/ML IV SOLN: 10.0000 mg | INTRAVENOUS | Status: AC | PRN

## 2019-12-26 MED ORDER — LIDOCAINE HCL (PF) 1 % IJ SOLN
INTRAMUSCULAR | Status: DC | PRN
  Administered 2019-12-26: 2 mL

## 2019-12-26 MED ORDER — VERAPAMIL HCL 2.5 MG/ML IV SOLN
INTRAVENOUS | Status: AC
  Filled 2019-12-26: qty 2

## 2019-12-26 MED FILL — Dopamine in Dextrose 5% Inj 3.2 MG/ML: INTRAVENOUS | Qty: 250 | Status: AC

## 2019-12-26 MED FILL — Verapamil HCl IV Soln 2.5 MG/ML: INTRAVENOUS | Qty: 2 | Status: AC

## 2019-12-26 MED FILL — Nitroglycerin IV Soln 100 MCG/ML in D5W: INTRA_ARTERIAL | Qty: 10 | Status: AC

## 2019-12-26 SURGICAL SUPPLY — 17 items
BALLN SAPPHIRE 2.0X12 (BALLOONS) ×2
BALLN SAPPHIRE ~~LOC~~ 2.75X15 (BALLOONS) ×1 IMPLANT
BALLOON SAPPHIRE 2.0X12 (BALLOONS) IMPLANT
CATH INFINITI JR4 5F (CATHETERS) ×1 IMPLANT
CATH VISTA GUIDE 6FR XBLAD3.5 (CATHETERS) ×1 IMPLANT
DEVICE RAD COMP TR BAND LRG (VASCULAR PRODUCTS) ×1 IMPLANT
GLIDESHEATH SLEND SS 6F .021 (SHEATH) ×1 IMPLANT
GUIDEWIRE INQWIRE 1.5J.035X260 (WIRE) IMPLANT
INQWIRE 1.5J .035X260CM (WIRE) ×2
KIT ENCORE 26 ADVANTAGE (KITS) ×1 IMPLANT
KIT HEART LEFT (KITS) ×2 IMPLANT
PACK CARDIAC CATHETERIZATION (CUSTOM PROCEDURE TRAY) ×2 IMPLANT
STENT SYNERGY XD 2.50X20 (Permanent Stent) IMPLANT
SYNERGY XD 2.50X20 (Permanent Stent) ×2 IMPLANT
TRANSDUCER W/STOPCOCK (MISCELLANEOUS) ×2 IMPLANT
TUBING CIL FLEX 10 FLL-RA (TUBING) ×2 IMPLANT
WIRE COUGAR XT STRL 190CM (WIRE) ×1 IMPLANT

## 2019-12-26 NOTE — Progress Notes (Signed)
  Echocardiogram 2D Echocardiogram has been performed.  Delcie Roch 12/26/2019, 8:30 AM

## 2019-12-26 NOTE — Progress Notes (Signed)
Progress Note  Patient Name: Marvin Velasquez Date of Encounter: 12/26/2019  [facility] Cardiologist: No primary care provider on file. New Dr Clifton James  Subjective   Still has mild chest pain. No dyspnea.   Inpatient Medications    Scheduled Meds: . aspirin  81 mg Oral Daily  . atorvastatin  80 mg Oral q1800  . atropine      . Chlorhexidine Gluconate Cloth  6 each Topical Daily  . heparin  4,000 Units Intravenous Once  . influenza vac split quadrivalent PF  0.5 mL Intramuscular Tomorrow-1000  .  morphine injection  4 mg Intravenous Once  . ondansetron (ZOFRAN) IV  4 mg Intravenous Once  . sodium chloride flush  3 mL Intravenous Q12H  . sodium chloride flush  3 mL Intravenous Q12H  . ticagrelor  90 mg Oral BID   Continuous Infusions: . sodium chloride    . sodium chloride    . sodium chloride    . sodium chloride 1 mL/kg/hr (12/26/19 0643)   PRN Meds: sodium chloride, sodium chloride, acetaminophen, morphine injection, ondansetron (ZOFRAN) IV, oxyCODONE, sodium chloride flush, sodium chloride flush   Vital Signs    Vitals:   12/26/19 0400 12/26/19 0500 12/26/19 0526 12/26/19 0600  BP: 122/71 111/67  131/77  Pulse: (!) 52 (!) 50  (!) 53  Resp: 18   20  Temp:      TempSrc:      SpO2: 96% 95%  95%  Weight:   76.5 kg   Height:        Intake/Output Summary (Last 24 hours) at 12/26/2019 0745 Last data filed at 12/26/2019 0643 Gross per 24 hour  Intake 1544.67 ml  Output 400 ml  Net 1144.67 ml   Last 3 Weights 12/26/2019 12/25/2019 12/25/2019  Weight (lbs) 168 lb 10.4 oz 165 lb 165 lb  Weight (kg) 76.5 kg 74.844 kg 74.844 kg      Telemetry    Sinus brady- Personally Reviewed  ECG    NSR with T wave inversion inferiorly. Marked ST elevation has resolved. - Personally Reviewed  Physical Exam    GEN: No acute distress.   Neck: No JVD Cardiac: RRR, no murmurs, rubs, or gallops.  Respiratory: Clear to auscultation bilaterally. GI: Soft, nontender,  non-distended  MS: No edema; No deformity. Neuro:  Nonfocal  Psych: Normal affect   Labs    High Sensitivity Troponin:   Recent Labs  Lab 12/25/19 1450 12/25/19 1611 12/25/19 2017  TROPONINIHS 15 9,333* 24,320*      Chemistry Recent Labs  Lab 12/25/19 1450 12/26/19 0032  NA 128* 136  K 3.3* 4.4  CL 97* 106  CO2 22 23  GLUCOSE 147* 134*  BUN 21* 18  CREATININE 0.83 1.06  CALCIUM 7.9* 8.6*  PROT 5.9* 5.6*  ALBUMIN 3.6 3.3*  AST 28 112*  ALT 23 31  ALKPHOS 50 45  BILITOT 0.6 0.9  GFRNONAA >60 >60  ANIONGAP 9 7     Hematology Recent Labs  Lab 12/25/19 1450 12/26/19 0032  WBC 8.8 8.2  RBC 4.41 4.39  HGB 14.3 14.1  HCT 40.3 41.0  MCV 91.4 93.4  MCH 32.4 32.1  MCHC 35.5 34.4  RDW 11.9 12.2  PLT 185 210    BNPNo results for input(s): BNP, PROBNP in the last 168 hours.   DDimer No results for input(s): DDIMER in the last 168 hours.   Radiology    CARDIAC CATHETERIZATION  Result Date: 12/25/2019  Mid RCA  lesion is 100% stenosed.  Prox Cx to Mid Cx lesion is 20% stenosed.  Mid LAD lesion is 80% stenosed.  A drug-eluting stent was successfully placed using a SYNERGY XD 3.0X16.  Post intervention, there is a 0% residual stenosis.  1. Acute inferior STEMI secondary to thrombotic occlusion of the mid RCA 2. Successful PTCA/DES x 1 mid RCA 3. Severe stenosis mid LAD Recommendations: Continue Aggrastat for 2 hours. Will continue DAPT with ASA and Brilinta for one year. Will start a high intensity statin. Will start a beta blocker tomorrow if BP tolerates. Echo tomorrow. I will plan a staged PCI of the LAD tomorrow if he remains stable.   DG Chest Port 1 View  Result Date: 12/25/2019 CLINICAL DATA:  54 year old male with chest pain. EXAM: PORTABLE CHEST 1 VIEW COMPARISON:  None. FINDINGS: No focal consolidation, pleural effusion, pneumothorax. The cardiac silhouette is within limits. Atherosclerotic calcification of the aorta. No acute osseous pathology.  Partially visualized lower cervical ACDF. IMPRESSION: No active disease. Electronically Signed   By: Elgie Collard M.D.   On: 12/25/2019 18:05    Cardiac Studies   See above  Patient Profile     54 y.o. male admitted with acute inferior STEMI  Assessment & Plan    1. Acute inferior STEMI. Troponin 24,000. Emergent stenting of occluded mid RCA with DES. Complete resolution of ST elevation. Residual 80% stenosis in the mid LAD. Planned for staged PCI of the LAD today. On DAPT with ASA and Brilinta. Not a candidate for beta blocker due to bradycardia. On high dose statin. Anticipate transfer to floor post PCI today with possible DC tomorrow. Echo is pending.  2. Hypercholesterolemia. On high dose statin 3. Family history of CAD   For questions or updates, please contact [facility] Please consult [email or web address] for contact info under        Signed, Willy Pinkerton [place], MD  12/26/2019, 7:45 AM

## 2019-12-26 NOTE — Interval H&P Note (Signed)
History and Physical Interval Note:  12/26/2019 11:06 AM  Marvin Velasquez  has presented today for surgery, with the diagnosis of CAD.  The various methods of treatment have been discussed with the patient and family. After consideration of risks, benefits and other options for treatment, the patient has consented to  Procedure(s): CORONARY STENT INTERVENTION (N/A) as a surgical intervention.  The patient's history has been reviewed, patient examined, no change in status, stable for surgery.  I have reviewed the patient's chart and labs.  Questions were answered to the patient's satisfaction.    Cath Lab Visit (complete for each Cath Lab visit)  Clinical Evaluation Leading to the Procedure:   ACS: Yes.    Non-ACS:    Anginal Classification: CCS III  Anti-ischemic medical therapy: No Therapy  Non-Invasive Test Results: No non-invasive testing performed  Prior CABG: No previous CABG        Verne Carrow

## 2019-12-26 NOTE — Plan of Care (Signed)

## 2019-12-26 NOTE — H&P (View-Only) (Signed)
Progress Note  Patient Name: Marvin Velasquez Date of Encounter: 12/26/2019  [facility] Cardiologist: No primary care provider on file. New Dr Clifton James  Subjective   Still has mild chest pain. No dyspnea.   Inpatient Medications    Scheduled Meds: . aspirin  81 mg Oral Daily  . atorvastatin  80 mg Oral q1800  . atropine      . Chlorhexidine Gluconate Cloth  6 each Topical Daily  . heparin  4,000 Units Intravenous Once  . influenza vac split quadrivalent PF  0.5 mL Intramuscular Tomorrow-1000  .  morphine injection  4 mg Intravenous Once  . ondansetron (ZOFRAN) IV  4 mg Intravenous Once  . sodium chloride flush  3 mL Intravenous Q12H  . sodium chloride flush  3 mL Intravenous Q12H  . ticagrelor  90 mg Oral BID   Continuous Infusions: . sodium chloride    . sodium chloride    . sodium chloride    . sodium chloride 1 mL/kg/hr (12/26/19 0643)   PRN Meds: sodium chloride, sodium chloride, acetaminophen, morphine injection, ondansetron (ZOFRAN) IV, oxyCODONE, sodium chloride flush, sodium chloride flush   Vital Signs    Vitals:   12/26/19 0400 12/26/19 0500 12/26/19 0526 12/26/19 0600  BP: 122/71 111/67  131/77  Pulse: (!) 52 (!) 50  (!) 53  Resp: 18   20  Temp:      TempSrc:      SpO2: 96% 95%  95%  Weight:   76.5 kg   Height:        Intake/Output Summary (Last 24 hours) at 12/26/2019 0745 Last data filed at 12/26/2019 0643 Gross per 24 hour  Intake 1544.67 ml  Output 400 ml  Net 1144.67 ml   Last 3 Weights 12/26/2019 12/25/2019 12/25/2019  Weight (lbs) 168 lb 10.4 oz 165 lb 165 lb  Weight (kg) 76.5 kg 74.844 kg 74.844 kg      Telemetry    Sinus brady- Personally Reviewed  ECG    NSR with T wave inversion inferiorly. Marked ST elevation has resolved. - Personally Reviewed  Physical Exam    GEN: No acute distress.   Neck: No JVD Cardiac: RRR, no murmurs, rubs, or gallops.  Respiratory: Clear to auscultation bilaterally. GI: Soft, nontender,  non-distended  MS: No edema; No deformity. Neuro:  Nonfocal  Psych: Normal affect   Labs    High Sensitivity Troponin:   Recent Labs  Lab 12/25/19 1450 12/25/19 1611 12/25/19 2017  TROPONINIHS 15 9,333* 24,320*      Chemistry Recent Labs  Lab 12/25/19 1450 12/26/19 0032  NA 128* 136  K 3.3* 4.4  CL 97* 106  CO2 22 23  GLUCOSE 147* 134*  BUN 21* 18  CREATININE 0.83 1.06  CALCIUM 7.9* 8.6*  PROT 5.9* 5.6*  ALBUMIN 3.6 3.3*  AST 28 112*  ALT 23 31  ALKPHOS 50 45  BILITOT 0.6 0.9  GFRNONAA >60 >60  ANIONGAP 9 7     Hematology Recent Labs  Lab 12/25/19 1450 12/26/19 0032  WBC 8.8 8.2  RBC 4.41 4.39  HGB 14.3 14.1  HCT 40.3 41.0  MCV 91.4 93.4  MCH 32.4 32.1  MCHC 35.5 34.4  RDW 11.9 12.2  PLT 185 210    BNPNo results for input(s): BNP, PROBNP in the last 168 hours.   DDimer No results for input(s): DDIMER in the last 168 hours.   Radiology    CARDIAC CATHETERIZATION  Result Date: 12/25/2019  Mid RCA  lesion is 100% stenosed.  Prox Cx to Mid Cx lesion is 20% stenosed.  Mid LAD lesion is 80% stenosed.  A drug-eluting stent was successfully placed using a SYNERGY XD 3.0X16.  Post intervention, there is a 0% residual stenosis.  1. Acute inferior STEMI secondary to thrombotic occlusion of the mid RCA 2. Successful PTCA/DES x 1 mid RCA 3. Severe stenosis mid LAD Recommendations: Continue Aggrastat for 2 hours. Will continue DAPT with ASA and Brilinta for one year. Will start a high intensity statin. Will start a beta blocker tomorrow if BP tolerates. Echo tomorrow. I will plan a staged PCI of the LAD tomorrow if he remains stable.   DG Chest Port 1 View  Result Date: 12/25/2019 CLINICAL DATA:  53-year-old male with chest pain. EXAM: PORTABLE CHEST 1 VIEW COMPARISON:  None. FINDINGS: No focal consolidation, pleural effusion, pneumothorax. The cardiac silhouette is within limits. Atherosclerotic calcification of the aorta. No acute osseous pathology.  Partially visualized lower cervical ACDF. IMPRESSION: No active disease. Electronically Signed   By: Arash  Radparvar M.D.   On: 12/25/2019 18:05    Cardiac Studies   See above  Patient Profile     53 y.o. male admitted with acute inferior STEMI  Assessment & Plan    1. Acute inferior STEMI. Troponin 24,000. Emergent stenting of occluded mid RCA with DES. Complete resolution of ST elevation. Residual 80% stenosis in the mid LAD. Planned for staged PCI of the LAD today. On DAPT with ASA and Brilinta. Not a candidate for beta blocker due to bradycardia. On high dose statin. Anticipate transfer to floor post PCI today with possible DC tomorrow. Echo is pending.  2. Hypercholesterolemia. On high dose statin 3. Family history of CAD   For questions or updates, please contact [facility] Please consult [email or web address] for contact info under        Signed, Xin Klawitter, MD  12/26/2019, 7:45 AM    

## 2019-12-27 ENCOUNTER — Encounter (HOSPITAL_COMMUNITY): Payer: Self-pay | Admitting: Cardiovascular Disease

## 2019-12-27 DIAGNOSIS — E785 Hyperlipidemia, unspecified: Secondary | ICD-10-CM

## 2019-12-27 DIAGNOSIS — I2119 ST elevation (STEMI) myocardial infarction involving other coronary artery of inferior wall: Secondary | ICD-10-CM | POA: Diagnosis not present

## 2019-12-27 DIAGNOSIS — I25110 Atherosclerotic heart disease of native coronary artery with unstable angina pectoris: Secondary | ICD-10-CM | POA: Diagnosis not present

## 2019-12-27 LAB — CBC
HCT: 42 % (ref 39.0–52.0)
Hemoglobin: 14.4 g/dL (ref 13.0–17.0)
MCH: 32.7 pg (ref 26.0–34.0)
MCHC: 34.3 g/dL (ref 30.0–36.0)
MCV: 95.2 fL (ref 80.0–100.0)
Platelets: 198 10*3/uL (ref 150–400)
RBC: 4.41 MIL/uL (ref 4.22–5.81)
RDW: 12.4 % (ref 11.5–15.5)
WBC: 7.8 10*3/uL (ref 4.0–10.5)
nRBC: 0 % (ref 0.0–0.2)

## 2019-12-27 LAB — BASIC METABOLIC PANEL
Anion gap: 10 (ref 5–15)
BUN: 15 mg/dL (ref 6–20)
CO2: 22 mmol/L (ref 22–32)
Calcium: 8.6 mg/dL — ABNORMAL LOW (ref 8.9–10.3)
Chloride: 107 mmol/L (ref 98–111)
Creatinine, Ser: 0.99 mg/dL (ref 0.61–1.24)
GFR, Estimated: >60 mL/min (ref >60)
Glucose, Bld: 96 mg/dL (ref 70–99)
Potassium: 4.3 mmol/L (ref 3.5–5.1)
Sodium: 139 mmol/L (ref 135–145)

## 2019-12-27 MED ORDER — METOPROLOL TARTRATE 12.5 MG HALF TABLET
12.5000 mg | ORAL_TABLET | Freq: Two times a day (BID) | ORAL | Status: DC
  Administered 2019-12-27 – 2019-12-28 (×3): 12.5 mg via ORAL
  Filled 2019-12-27 (×3): qty 1

## 2019-12-27 NOTE — Progress Notes (Signed)
CARDIAC REHAB PHASE I   PRE:  Rate/Rhythm: 74 SR  BP:  Supine:   Sitting: 112/73  Standing:    SaO2: 96%RA  MODE:  Ambulation: 740 ft   POST:  Rate/Rhythm: 78 SR  BP:  Supine:   Sitting: 129/79  Standing:    SaO2: 98%RA 0840-0935 Pt walked 740 ft with steady gait and tolerated well. No CP. MI education completed with pt and wife who voiced understanding. .Stressed importance of brilinta with stent. Reviewed NTG use, MI restrictions, walking for ex, heart healthy food choices, and CRP 2. Pt works in [employer] so referred to program here.    Luetta Nutting, RN BSN  12/27/2019 9:30 AM

## 2019-12-27 NOTE — Progress Notes (Addendum)
Progress Note  Patient Name: Marvin Velasquez Date of Encounter: 12/27/2019  Primary Cardiologist: Sanjuana Kava  Subjective   Day 2 s/p STEMI; pain free today but had cp until Post PCI until ~ 5 pm yesterday.  Inpatient Medications    Scheduled Meds: . aspirin  81 mg Oral Daily  . atorvastatin  80 mg Oral q1800  . Chlorhexidine Gluconate Cloth  6 each Topical Daily  . influenza vac split quadrivalent PF  0.5 mL Intramuscular Tomorrow-1000  .  morphine injection  4 mg Intravenous Once  . ondansetron (ZOFRAN) IV  4 mg Intravenous Once  . pantoprazole  40 mg Oral Daily  . sodium chloride flush  3 mL Intravenous Q12H  . sodium chloride flush  3 mL Intravenous Q12H  . ticagrelor  90 mg Oral BID   Continuous Infusions: . sodium chloride    . sodium chloride     PRN Meds: sodium chloride, sodium chloride, acetaminophen, morphine injection, ondansetron (ZOFRAN) IV, oxyCODONE, sodium chloride flush, sodium chloride flush   Vital Signs    Vitals:   12/26/19 2035 12/27/19 0020 12/27/19 0437 12/27/19 0747  BP: 103/77 114/81 106/65   Pulse:  61    Resp: 16 11 11    Temp:  98.9 F (37.2 C)  97.7 F (36.5 C)  TempSrc:  Oral    SpO2:  96% 94%   Weight:      Height:        Intake/Output Summary (Last 24 hours) at 12/27/2019 0812 Last data filed at 12/26/2019 2100 Gross per 24 hour  Intake 448.45 ml  Output --  Net 448.45 ml    I/O since admission: +1689  Filed Weights   12/25/19 1400 12/25/19 1431 12/26/19 0526  Weight: 74.8 kg 74.8 kg 76.5 kg    Telemetry    Sinus 70-80s - Personally Reviewed  ECG    ECG (independently read by me): NSR at 67; T inversion III, aVF  Physical Exam   BP 106/65 (BP Location: Left Arm)   Pulse 61   Temp 97.7 F (36.5 C)   Resp 11   Ht 5\' 9"  (1.753 m)   Wt 76.5 kg   SpO2 94%   BMI 24.91 kg/m  General: Alert, oriented, no distress.  Skin: normal turgor, no rashes, warm and dry HEENT: Normocephalic, atraumatic. Pupils equal  round and reactive to light; sclera anicteric; extraocular muscles intact; Nose without nasal septal hypertrophy Mouth/Parynx benign;  Neck: No JVD, no carotid bruits; normal carotid upstroke Lungs: clear to ausculatation and percussion; no wheezing or rales Chest wall: without tenderness to palpitation Heart: PMI not displaced, RRR, s1 s2 normal, trivial systolic murmur, no diastolic murmur, no rubs, gallops, thrills, or heaves Abdomen: soft, nontender; no hepatosplenomehaly, BS+; abdominal aorta nontender and not dilated by palpation. Back: no CVA tenderness Pulses 2+ R radial site stable; R groin ecchymosis Musculoskeletal: full range of motion, normal strength, no joint deformities Extremities: no clubbing cyanosis or edema, Homan's sign negative  Neurologic: grossly nonfocal; Cranial nerves grossly wnl Psychologic: Normal mood and affect   Labs    Chemistry Recent Labs  Lab 12/25/19 1450 12/26/19 0032 12/27/19 0200  NA 130*  128* 136 139  K 3.4*  3.3* 4.4 4.3  CL 94*  97* 106 107  CO2 22 23 22   GLUCOSE 134*  147* 134* 96  BUN 23*  21* 18 15  CREATININE 0.60*  0.83 1.06 0.99  CALCIUM 7.9* 8.6* 8.6*  PROT 5.9* 5.6*  --  ALBUMIN 3.6 3.3*  --   AST 28 112*  --   ALT 23 31  --   ALKPHOS 50 45  --   BILITOT 0.6 0.9  --   GFRNONAA >60 >60 >60  ANIONGAP Hematology Recent Labs  Lab 12/25/19 1450 12/26/19 0032 12/27/19 0200  WBC 8.8 8.2 7.8  RBC 4.41 4.39 4.41  HGB 13.3  14.3 14.1 14.4  HCT 39.0  40.3 41.0 42.0  MCV 91.4 93.4 95.2  MCH 32.4 32.1 32.7  MCHC 35.5 34.4 34.3  RDW 11.9 12.2 12.4  PLT 185 210 198    HS Trop: 15 > 9333 > 24320  Cardiac EnzymesNo results for input(s): TROPONINI in the last 168 hours. No results for input(s): TROPIPOC in the last 168 hours.   BNPNo results for input(s): BNP, PROBNP in the last 168 hours.   DDimer No results for input(s): DDIMER in the last 168 hours.   Lipid Panel     Component Value  Date/Time   CHOL 182 12/25/2019 1450   TRIG 164 (H) 12/25/2019 1450   HDL 34 (L) 12/25/2019 1450   CHOLHDL 5.4 12/25/2019 1450   VLDL 33 12/25/2019 1450   LDLCALC 115 (H) 12/25/2019 1450     Radiology    CARDIAC CATHETERIZATION  Result Date: 12/26/2019  Mid LAD lesion is 80% stenosed.  Prox Cx to Mid Cx lesion is 20% stenosed.  Previously placed Mid RCA drug eluting stent is widely patent.  Balloon angioplasty was performed.  A drug-eluting stent was successfully placed using a SYNERGY XD 2.50X20.  Post intervention, there is a 0% residual stenosis.  1. Patent RCA with patent mid stent 2. Severe stenosis mid LAD 3. Successful PTCA/DES x 1 mid LAD Recommendations: Continue ASA and Brilinta for one year. Continue statin. No beta blocker with bradycardia. Probable discharge home tomorrow. He will be taken back to the ICU as there are no beds in our holding area or on telemetry units.   CARDIAC CATHETERIZATION  Result Date: 12/25/2019  Mid RCA lesion is 100% stenosed.  Prox Cx to Mid Cx lesion is 20% stenosed.  Mid LAD lesion is 80% stenosed.  A drug-eluting stent was successfully placed using a SYNERGY XD 3.0X16.  Post intervention, there is a 0% residual stenosis.  1. Acute inferior STEMI secondary to thrombotic occlusion of the mid RCA 2. Successful PTCA/DES x 1 mid RCA 3. Severe stenosis mid LAD Recommendations: Continue Aggrastat for 2 hours. Will continue DAPT with ASA and Brilinta for one year. Will start a high intensity statin. Will start a beta blocker tomorrow if BP tolerates. Echo tomorrow. I will plan a staged PCI of the LAD tomorrow if he remains stable.   DG Chest Port 1 View  Result Date: 12/25/2019 CLINICAL DATA:  54 year old male with chest pain. EXAM: PORTABLE CHEST 1 VIEW COMPARISON:  None. FINDINGS: No focal consolidation, pleural effusion, pneumothorax. The cardiac silhouette is within limits. Atherosclerotic calcification of the aorta. No acute osseous pathology.  Partially visualized lower cervical ACDF. IMPRESSION: No active disease. Electronically Signed   By: Elgie Collard M.D.   On: 12/25/2019 18:05   ECHOCARDIOGRAM COMPLETE  Result Date: 12/26/2019    ECHOCARDIOGRAM REPORT   Patient Name:   Marvin Velasquez Date of Exam: 12/26/2019 Medical Rec #:  [id number]    Height:       69.0 in Accession #:    [id number]   Weight:  168.7 lb Date of Birth:  Nov 25, 1965   BSA:          1.922 m Patient Age:    53 years     BP:           131/77 mmHg Patient Gender: M            HR:           53 bpm. Exam Location:  Inpatient Procedure: 2D Echo Indications:    cad of native vessel 414.01  History:        Patient has no prior history of Echocardiogram examinations.  Sonographer:    Delcie Roch Referring Phys: [id number] CHRISTOPHER D MCALHANY IMPRESSIONS  1. Left ventricular ejection fraction, by estimation, is 55 to 60%. The left ventricle has normal function. The left ventricle has no regional wall motion abnormalities. Left ventricular diastolic parameters were normal.  2. Right ventricular systolic function is normal. The right ventricular size is normal. Tricuspid regurgitation signal is inadequate for assessing PA pressure.  3. The mitral valve is grossly normal. Trivial mitral valve regurgitation. No evidence of mitral stenosis.  4. The aortic valve is tricuspid. Aortic valve regurgitation is not visualized. No aortic stenosis is present.  5. The inferior vena cava is normal in size with greater than 50% respiratory variability, suggesting right atrial pressure of 3 mmHg. Conclusion(s)/Recommendation(s): Normal biventricular function without evidence of hemodynamically significant valvular heart disease. FINDINGS  Left Ventricle: Left ventricular ejection fraction, by estimation, is 55 to 60%. The left ventricle has normal function. The left ventricle has no regional wall motion abnormalities. The left ventricular internal cavity size was normal in size. There is  no left  ventricular hypertrophy. Left ventricular diastolic parameters were normal. Right Ventricle: The right ventricular size is normal. No increase in right ventricular wall thickness. Right ventricular systolic function is normal. Tricuspid regurgitation signal is inadequate for assessing PA pressure. Left Atrium: Left atrial size was normal in size. Right Atrium: Right atrial size was normal in size. Pericardium: Trivial pericardial effusion is present. Presence of pericardial fat pad. Mitral Valve: The mitral valve is grossly normal. Trivial mitral valve regurgitation. No evidence of mitral valve stenosis. Tricuspid Valve: The tricuspid valve is grossly normal. Tricuspid valve regurgitation is trivial. No evidence of tricuspid stenosis. Aortic Valve: The aortic valve is tricuspid. Aortic valve regurgitation is not visualized. No aortic stenosis is present. Pulmonic Valve: The pulmonic valve was grossly normal. Pulmonic valve regurgitation is not visualized. No evidence of pulmonic stenosis. Aorta: The aortic root and ascending aorta are structurally normal, with no evidence of dilitation. Venous: The inferior vena cava is normal in size with greater than 50% respiratory variability, suggesting right atrial pressure of 3 mmHg. IAS/Shunts: The atrial septum is grossly normal.  LEFT VENTRICLE PLAX 2D LVIDd:         4.80 cm  Diastology LVIDs:         3.50 cm  LV e' medial:    10.60 cm/s LV PW:         1.10 cm  LV E/e' medial:  6.9 LV IVS:        1.10 cm  LV e' lateral:   12.20 cm/s LVOT diam:     1.90 cm  LV E/e' lateral: 6.0 LV SV:         82 LV SV Index:   43 LVOT Area:     2.84 cm  RIGHT VENTRICLE  IVC RV S prime:     14.70 cm/s  IVC diam: 1.80 cm TAPSE (M-mode): 2.8 cm LEFT ATRIUM             Index       RIGHT ATRIUM           Index LA diam:        3.60 cm 1.87 cm/m  RA Area:     14.40 cm LA Vol (A2C):   34.3 ml 17.85 ml/m RA Volume:   35.00 ml  18.21 ml/m LA Vol (A4C):   42.6 ml 22.17 ml/m LA Biplane  Vol: 39.0 ml 20.30 ml/m  AORTIC VALVE LVOT Vmax:   140.00 cm/s LVOT Vmean:  82.500 cm/s LVOT VTI:    0.289 m  AORTA Ao Root diam: 3.40 cm Ao Asc diam:  3.30 cm MITRAL VALVE MV Area (PHT): 2.62 cm    SHUNTS MV Decel Time: 290 msec    Systemic VTI:  0.29 m MV E velocity: 73.30 cm/s  Systemic Diam: 1.90 cm MV A velocity: 76.70 cm/s MV E/A ratio:  0.96 Lennie Odor MD Electronically signed by Lennie Odor MD Signature Date/Time: 12/26/2019/10:26:10 AM    Final     Cardiac Studies   EMERGENT CATH/PCI 12/25/19  Mid RCA lesion is 100% stenosed.  Prox Cx to Mid Cx lesion is 20% stenosed.  Mid LAD lesion is 80% stenosed.  A drug-eluting stent was successfully placed using a SYNERGY XD 3.0X16.  Post intervention, there is a 0% residual stenosis.   1. Acute inferior STEMI secondary to thrombotic occlusion of the mid RCA 2. Successful PTCA/DES x 1 mid RCA 3. Severe stenosis mid LAD  Recommendations: Continue Aggrastat for 2 hours. Will continue DAPT with ASA and Brilinta for one year. Will start a high intensity statin. Will start a beta blocker tomorrow if BP tolerates. Echo tomorrow. I will plan a staged PCI of the LAD tomorrow if he remains stable.    Intervention       PCI 12/26/2019  Mid LAD lesion is 80% stenosed.  Prox Cx to Mid Cx lesion is 20% stenosed.  Previously placed Mid RCA drug eluting stent is widely patent.  Balloon angioplasty was performed.  A drug-eluting stent was successfully placed using a SYNERGY XD 2.50X20.  Post intervention, there is a 0% residual stenosis.   1. Patent RCA with patent mid stent 2. Severe stenosis mid LAD 3. Successful PTCA/DES x 1 mid LAD  Recommendations: Continue ASA and Brilinta for one year. Continue statin. No beta blocker with bradycardia. Probable discharge home tomorrow. He will be taken back to the ICU as there are no beds in our holding area or on telemetry units.      I ECHO MPRESSIONS  1. Left ventricular  ejection fraction, by estimation, is 55 to 60%. The  left ventricle has normal function. The left ventricle has no regional  wall motion abnormalities. Left ventricular diastolic parameters were  normal.  2. Right ventricular systolic function is normal. The right ventricular  size is normal. Tricuspid regurgitation signal is inadequate for assessing  PA pressure.  3. The mitral valve is grossly normal. Trivial mitral valve  regurgitation. No evidence of mitral stenosis.  4. The aortic valve is tricuspid. Aortic valve regurgitation is not  visualized. No aortic stenosis is present.  5. The inferior vena cava is normal in size with greater than 50%  respiratory variability, suggesting right atrial pressure of 3 mmHg.   Conclusion(s)/Recommendation(s): Normal biventricular function  without  evidence of hemodynamically significant valvular heart disease.   Patient Profile     54 y.o. male admitted with STEMI Rx with PCI RCA 10/28 and PCI LAD 10/29.   Assessment & Plan    1.  Day 2 status post acute inferior STEMI secondary to total occlusion of the mid RCA.  Troponin increased to 24,000.  ST elevation has completely resolved.  Status post staged PCI to LAD yesterday.  Patient had experienced chest pain post procedure and now is completely pain-free.  ECG today shows T wave inversion in 3 and aVF.  Echo Doppler study shows normal LV function without wall motion abnormalities.  He is on aspirin/Brilinta.  We will attempt to add low-dose metoprolol tartrate 12.5 mg twice a day today since his resting pulse is now in the 70s to 80s.  Apparently initially he had been bradycardic.  Will transfer to floor today ambulate and make certain he tolerates beta-blocker therapy.  2.  Concomitant CAD: Status post PCI to LAD yesterday and with mild nonobstructive CAD involving the left circumflex vessel.  Post MI we will attempt to add low-dose beta-blocker therapy.  3.  Hyperlipidemia: Initial LDL  cholesterol 115.  He is now on atorvastatin 80 mg aim for target LDL 55 or below if possible.  Transfer to cardiac telemetry.  Aim for discharge tomorrow.   Signed, Lennette Biharihomas A. Vitalia Stough, MD, [facility] Center[facility]ACC 12/27/2019, 8:12 AM

## 2019-12-28 LAB — GLUCOSE, CAPILLARY: Glucose-Capillary: 150 mg/dL — ABNORMAL HIGH (ref 70–99)

## 2019-12-28 MED ORDER — ATORVASTATIN CALCIUM 80 MG PO TABS: 80.0000 mg | ORAL_TABLET | Freq: Every day | ORAL | 11 refills | Status: DC

## 2019-12-28 MED ORDER — PANTOPRAZOLE SODIUM 40 MG PO TBEC: 40.0000 mg | DELAYED_RELEASE_TABLET | Freq: Every day | ORAL | 11 refills | Status: DC

## 2019-12-28 MED ORDER — ASPIRIN 81 MG PO CHEW: 81.0000 mg | CHEWABLE_TABLET | Freq: Every day | ORAL | Status: DC

## 2019-12-28 MED ORDER — NITROGLYCERIN 0.4 MG SL SUBL: 0.4000 mg | SUBLINGUAL_TABLET | SUBLINGUAL | 3 refills | Status: DC | PRN

## 2019-12-28 MED ORDER — METOPROLOL TARTRATE 25 MG PO TABS: 12.5000 mg | ORAL_TABLET | Freq: Two times a day (BID) | ORAL | 11 refills | Status: DC

## 2019-12-28 MED ORDER — TICAGRELOR 90 MG PO TABS: 90.0000 mg | ORAL_TABLET | Freq: Two times a day (BID) | ORAL | 0 refills | Status: DC

## 2019-12-28 MED ORDER — TICAGRELOR 90 MG PO TABS: 90.0000 mg | ORAL_TABLET | Freq: Two times a day (BID) | ORAL | 11 refills | Status: DC

## 2019-12-28 NOTE — Discharge Instructions (Signed)
PLEASE REMEMBER TO BRING ALL OF YOUR MEDICATIONS TO EACH OF YOUR FOLLOW-UP OFFICE VISITS.  PLEASE ATTEND ALL SCHEDULED FOLLOW-UP APPOINTMENTS.   Activity: Increase activity slowly as tolerated. You may shower, but no soaking baths (or swimming) for 1 week. No driving for 1 week. No lifting over 5 lbs for 2 weeks. No sexual activity for 1 week.   You May Return to Work: in 2-3 weeks (if applicable)  Wound Care: You may wash cath site gently with soap and water. Keep cath site clean and dry. If you notice pain, swelling, bleeding or pus at your cath site, please call [phone].    Cardiac Cath Site Care Refer to this sheet in the next few weeks. These instructions provide you with information on caring for yourself after your procedure. Your caregiver may also give you more specific instructions. Your treatment has been planned according to current medical practices, but problems sometimes occur. Call your caregiver if you have any problems or questions after your procedure. HOME CARE INSTRUCTIONS  You may shower 24 hours after the procedure. Remove the bandage (dressing) and gently wash the site with plain soap and water. Gently pat the site dry.   Do not apply powder or lotion to the site.   Do not sit in a bathtub, swimming pool, or whirlpool for 5 to 7 days.   No bending, squatting, or lifting anything over 10 pounds (4.5 kg) as directed by your caregiver.   Inspect the site at least twice daily.   Do not drive home if you are discharged the same day of the procedure. Have someone else drive you.   You may drive 24 hours after the procedure unless otherwise instructed by your caregiver.  What to expect:  Any bruising will usually fade within 1 to 2 weeks.   Blood that collects in the tissue (hematoma) may be painful to the touch. It should usually decrease in size and tenderness within 1 to 2 weeks.  SEEK IMMEDIATE MEDICAL CARE IF:  You have unusual pain at the site or down the  affected limb.   You have redness, warmth, swelling, or pain at the site.   You have drainage (other than a small amount of blood on the dressing).   You have chills.   You have a fever or persistent symptoms for more than 72 hours.   You have a fever and your symptoms suddenly get worse.   Your leg becomes pale, cool, tingly, or numb.   You have heavy bleeding from the site. Hold pressure on the site.  Document Released: 03/18/2010 Document Revised: 02/02/2011 Document Reviewed:     Information about your medication: Brilinta (anti-platelet agent)  Generic Name (Brand): ticagrelor (Brilinta), twice daily medication  PURPOSE: You are taking this medication along with aspirin to lower your chance of having a heart attack, stroke, or blood clots in your heart stent. These can be fatal. Brilinta and aspirin help prevent platelets from sticking together and forming a clot that can block an artery or your stent.   Common SIDE EFFECTS you may experience include: bruising or bleeding more easily, shortness of breath  Do not stop taking BRILINTA without talking to the doctor who prescribes it for you. People who are treated with a stent and stop taking Brilinta too soon, have a higher risk of getting a blood clot in the stent, having a heart attack, or dying. If you stop Brilinta because of bleeding, or for other reasons, your risk of a  heart attack or stroke may increase.   Avoid taking NSAID agents or anti-inflammatory medications such as ibuprofen, naproxen given increased bleed risk with plavix - can use acetaminophen (Tylenol) if needed for pain.  Tell all of your doctors and dentists that you are taking Brilinta. They should talk to the doctor who prescribed Brilinta for you before you have any surgery or invasive procedure.   Contact your health care provider if you experience: severe or uncontrollable bleeding, pink/red/brown urine, vomiting blood or vomit that looks like "coffee  grounds", red or black stools (looks like tar), coughing up blood or blood clots ----------------------------------------------------------------------------------------------------------------------

## 2019-12-28 NOTE — Progress Notes (Signed)
Progress Note  Patient Name: Marvin Velasquez Date of Encounter: 12/28/2019  Primary Cardiologist: Sanjuana Kava  Subjective   Day 3 s/p STEMI; no recurrent chest pain  Inpatient Medications    Scheduled Meds:  aspirin  81 mg Oral Daily   atorvastatin  80 mg Oral q1800   Chlorhexidine Gluconate Cloth  6 each Topical Daily   metoprolol tartrate  12.5 mg Oral BID    morphine injection  4 mg Intravenous Once   ondansetron (ZOFRAN) IV  4 mg Intravenous Once   pantoprazole  40 mg Oral Daily   sodium chloride flush  3 mL Intravenous Q12H   sodium chloride flush  3 mL Intravenous Q12H   ticagrelor  90 mg Oral BID   Continuous Infusions:  sodium chloride     sodium chloride     PRN Meds: sodium chloride, sodium chloride, acetaminophen, morphine injection, ondansetron (ZOFRAN) IV, oxyCODONE, sodium chloride flush, sodium chloride flush   Vital Signs    Vitals:   12/27/19 1500 12/27/19 1523 12/27/19 2048 12/27/19 2329  BP:  104/72 121/70 119/62  Pulse:  68 67 71  Resp: 14  16 18   Temp:  98.3 F (36.8 C) 97.8 F (36.6 C) 98.5 F (36.9 C)  TempSrc:  Oral Oral Oral  SpO2:  99% 95% 95%  Weight:      Height:        Intake/Output Summary (Last 24 hours) at 12/28/2019 0916 Last data filed at 12/27/2019 2200 Gross per 24 hour  Intake 286 ml  Output 300 ml  Net -14 ml    I/O since admission: +1485  Filed Weights   12/25/19 1400 12/25/19 1431 12/26/19 0526  Weight: 74.8 kg 74.8 kg 76.5 kg    Telemetry    Sinus 65 - Personally Reviewed  ECG    12/27/2019 ECG (independently read by me): NSR at 67; T inversion III, aVF  Physical Exam   BP 119/62 (BP Location: Left Arm)    Pulse 71    Temp 98.5 F (36.9 C) (Oral)    Resp 18    Ht 5\' 9"  (1.753 m)    Wt 76.5 kg    SpO2 95%    BMI 24.91 kg/m  General: Alert, oriented, no distress.  Skin: normal turgor, no rashes, warm and dry HEENT: Normocephalic, atraumatic. Pupils equal round and reactive to light; sclera  anicteric; extraocular muscles intact;  Nose without nasal septal hypertrophy Mouth/Parynx benign;  Neck: No JVD, no carotid bruits; normal carotid upstroke Lungs: clear to ausculatation and percussion; no wheezing or rales Chest wall: without tenderness to palpitation Heart: PMI not displaced, RRR, s1 s2 normal, 1/6 systolic murmur, no diastolic murmur, no rubs, gallops, thrills, or heaves Abdomen: soft, nontender; no hepatosplenomehaly, BS+; abdominal aorta nontender and not dilated by palpation. Back: no CVA tenderness Pulses 2+R radial site stable; R groin ecchymosis without bruit Musculoskeletal: full range of motion, normal strength, no joint deformities Extremities: no clubbing cyanosis or edema, Homan's sign negative  Neurologic: grossly nonfocal; Cranial nerves grossly wnl Psychologic: Normal mood and affect   Labs    Chemistry Recent Labs  Lab 12/25/19 1450 12/26/19 0032 12/27/19 0200  NA 130*   128* 136 139  K 3.4*   3.3* 4.4 4.3  CL 94*   97* 106 107  CO2 22 23 22   GLUCOSE 134*   147* 134* 96  BUN 23*   21* 18 15  CREATININE 0.60*   0.83 1.06 0.99  CALCIUM 7.9*  8.6* 8.6*  PROT 5.9* 5.6*  --   ALBUMIN 3.6 3.3*  --   AST 28 112*  --   ALT 23 31  --   ALKPHOS 50 45  --   BILITOT 0.6 0.9  --   GFRNONAA >60 >60 >60  ANIONGAP 9 7 10      Hematology Recent Labs  Lab 12/25/19 1450 12/26/19 0032 12/27/19 0200  WBC 8.8 8.2 7.8  RBC 4.41 4.39 4.41  HGB 13.3   14.3 14.1 14.4  HCT 39.0   40.3 41.0 42.0  MCV 91.4 93.4 95.2  MCH 32.4 32.1 32.7  MCHC 35.5 34.4 34.3  RDW 11.9 12.2 12.4  PLT 185 210 198    HS Trop: 15 > 9333 > 24320  Cardiac EnzymesNo results for input(s): TROPONINI in the last 168 hours. No results for input(s): TROPIPOC in the last 168 hours.   BNPNo results for input(s): BNP, PROBNP in the last 168 hours.   DDimer No results for input(s): DDIMER in the last 168 hours.   Lipid Panel     Component Value Date/Time   CHOL 182 12/25/2019  1450   TRIG 164 (H) 12/25/2019 1450   HDL 34 (L) 12/25/2019 1450   CHOLHDL 5.4 12/25/2019 1450   VLDL 33 12/25/2019 1450   LDLCALC 115 (H) 12/25/2019 1450     Radiology    CARDIAC CATHETERIZATION  Result Date: 12/26/2019  Mid LAD lesion is 80% stenosed.  Prox Cx to Mid Cx lesion is 20% stenosed.  Previously placed Mid RCA drug eluting stent is widely patent.  Balloon angioplasty was performed.  A drug-eluting stent was successfully placed using a SYNERGY XD 2.50X20.  Post intervention, there is a 0% residual stenosis.  1. Patent RCA with patent mid stent 2. Severe stenosis mid LAD 3. Successful PTCA/DES x 1 mid LAD Recommendations: Continue ASA and Brilinta for one year. Continue statin. No beta blocker with bradycardia. Probable discharge home tomorrow. He will be taken back to the ICU as there are no beds in our holding area or on telemetry units.    Cardiac Studies   EMERGENT CATH/PCI 12/25/19  Mid RCA lesion is 100% stenosed.  Prox Cx to Mid Cx lesion is 20% stenosed.  Mid LAD lesion is 80% stenosed.  A drug-eluting stent was successfully placed using a SYNERGY XD 3.0X16.  Post intervention, there is a 0% residual stenosis.   1. Acute inferior STEMI secondary to thrombotic occlusion of the mid RCA 2. Successful PTCA/DES x 1 mid RCA 3. Severe stenosis mid LAD  Recommendations: Continue Aggrastat for 2 hours. Will continue DAPT with ASA and Brilinta for one year. Will start a high intensity statin. Will start a beta blocker tomorrow if BP tolerates. Echo tomorrow. I will plan a staged PCI of the LAD tomorrow if he remains stable.    Intervention       PCI 12/26/2019  Mid LAD lesion is 80% stenosed.  Prox Cx to Mid Cx lesion is 20% stenosed.  Previously placed Mid RCA drug eluting stent is widely patent.  Balloon angioplasty was performed.  A drug-eluting stent was successfully placed using a SYNERGY XD 2.50X20.  Post intervention, there is a 0%  residual stenosis.   1. Patent RCA with patent mid stent 2. Severe stenosis mid LAD 3. Successful PTCA/DES x 1 mid LAD  Recommendations: Continue ASA and Brilinta for one year. Continue statin. No beta blocker with bradycardia. Probable discharge home tomorrow. He will be taken back to  the ICU as there are no beds in our holding area or on telemetry units.      I ECHO MPRESSIONS  1. Left ventricular ejection fraction, by estimation, is 55 to 60%. The  left ventricle has normal function. The left ventricle has no regional  wall motion abnormalities. Left ventricular diastolic parameters were  normal.  2. Right ventricular systolic function is normal. The right ventricular  size is normal. Tricuspid regurgitation signal is inadequate for assessing  PA pressure.  3. The mitral valve is grossly normal. Trivial mitral valve  regurgitation. No evidence of mitral stenosis.  4. The aortic valve is tricuspid. Aortic valve regurgitation is not  visualized. No aortic stenosis is present.  5. The inferior vena cava is normal in size with greater than 50%  respiratory variability, suggesting right atrial pressure of 3 mmHg.   Conclusion(s)/Recommendation(s): Normal biventricular function without  evidence of hemodynamically significant valvular heart disease.   Patient Profile     54 y.o. male admitted with STEMI Rx with PCI RCA 10/28 and PCI LAD 10/29.   Assessment & Plan    1.  Day 3 status post acute inferior STEMI secondary to total occlusion of the mid RCA.  Troponin increased to 24,000.  ST elevation has completely resolved.  Status post staged PCI to LAD yesterday.  Patient had experienced chest pain post procedure and now is completely pain-free.  ECG  shows T wave inversion in 3 and aVF.  Echo Doppler study shows normal LV function without wall motion abnormalities.  He is on aspirin/Brilinta.  Low-dose metoprolol at 12.5 mg twice daily added yesterday.  Telemetry today shows  sinus rhythm in the mid 60s.  2.  Concomitant CAD: Status post PCI to LAD yesterday and with mild nonobstructive CAD involving the left circumflex vessel.  No recurrent chest pain.  Now on and tolerating low-dose beta-blocker therapy.  3.  Hyperlipidemia: Initial LDL cholesterol 115.  He is now on atorvastatin 80 mg aim for target LDL 55 or below if possible.  He is ambulated well without recurrent chest pain.  Plan discharge today with follow-up with Dr. Clifton James.  Patient lives in [place] but prefers to be seen in [place].  He is a Curator.  I have recommended he stay out of work at least for 1 week until he sees the APP for initial evaluation post hospitalization   Signed, Lennette Bihari, MD, [facility] 12/28/2019, 9:16 AM

## 2019-12-28 NOTE — Plan of Care (Signed)

## 2019-12-28 NOTE — Discharge Summary (Signed)
Discharge Summary    Patient ID: Marvin AbedKevin S Boeckman MRN: [id number]; DOB: 07-Dec-1965  Admit date: 12/25/2019 Discharge date: 12/28/2019  Primary Care Provider: Patient, No Pcp Per  Primary Cardiologist: Verne Carrowhristopher McAlhany, MD  Primary Electrophysiologist:  None   Discharge Diagnoses    Active Problems:   Acute ST elevation myocardial infarction (STEMI) of inferior wall [facility](HCC)   Coronary artery disease involving native coronary artery of native heart with unstable angina pectoris (HCC)   Allergies No Known Allergies  Diagnostic Studies/Procedures    EMERGENT CATH/PCI 12/25/19  Mid RCA lesion is 100% stenosed.  Prox Cx to Mid Cx lesion is 20% stenosed.  Mid LAD lesion is 80% stenosed.  A drug-eluting stent was successfully placed using a SYNERGY XD 3.0X16.  Post intervention, there is a 0% residual stenosis.  1. Acute inferior STEMI secondary to thrombotic occlusion of the mid RCA 2. Successful PTCA/DES x 1 mid RCA 3. Severe stenosis mid LAD  Recommendations: Continue Aggrastat for 2 hours. Will continue DAPT with ASA and Brilinta for one year. Will start a high intensity statin. Will start a beta blocker tomorrow if BP tolerates. Echo tomorrow. I will plan a staged PCI of the LAD tomorrow if he remains stable.    Intervention       PCI 12/26/2019  Mid LAD lesion is 80% stenosed.  Prox Cx to Mid Cx lesion is 20% stenosed.  Previously placed Mid RCA drug eluting stent is widely patent.  Balloon angioplasty was performed.  A drug-eluting stent was successfully placed using a SYNERGY XD 2.50X20.  Post intervention, there is a 0% residual stenosis.  1. Patent RCA with patent mid stent 2. Severe stenosis mid LAD 3. Successful PTCA/DES x 1 mid LAD  Recommendations: Continue ASA and Brilinta for one year. Continue statin. No beta blocker with bradycardia. Probable discharge home tomorrow. He will be taken back to the ICU as there are no beds in our  holding area or on telemetry units.      I ECHO MPRESSIONS  1. Left ventricular ejection fraction, by estimation, is 55 to 60%. The  left ventricle has normal function. The left ventricle has no regional  wall motion abnormalities. Left ventricular diastolic parameters were  normal.  2. Right ventricular systolic function is normal. The right ventricular  size is normal. Tricuspid regurgitation signal is inadequate for assessing  PA pressure.  3. The mitral valve is grossly normal. Trivial mitral valve  regurgitation. No evidence of mitral stenosis.  4. The aortic valve is tricuspid. Aortic valve regurgitation is not  visualized. No aortic stenosis is present.  5. The inferior vena cava is normal in size with greater than 50%  respiratory variability, suggesting right atrial pressure of 3 mmHg.   Conclusion(s)/Recommendation(s): Normal biventricular function without  evidence of hemodynamically significant valvular heart disease.  _____________   History of Present Illness     Marvin Velasquez is a 54 y.o. male with no history of coronary artery disease, no known history of diabetes, hypertension or hyperlipidemia.  He was admitted on 10/28 with an inferior STEMI.  Hospital Course     Consultants: None  Cardiac catheterization results are above.  He had DES to the RCA as the culprit lesion.  He was brought back to the lab the next day and a stent was placed to the mid LAD to treat an 80% lesion.  He tolerated both procedures well.  He is on dual antiplatelet therapy and high-dose statin.  He is tolerating  a low-dose beta-blocker.  He ambulated with cardiac rehab and tolerated it well.  No chest pain or shortness of breath with exertion.  On 10/31, he was seen by Dr. Tresa Endo and all data were reviewed.  No further inpatient work-up is indicated and he is considered stable for discharge, to follow-up as an outpatient.   Did the patient have an acute coronary syndrome (MI,  NSTEMI, STEMI, etc) this admission?:  Yes                               AHA/ACC Clinical Performance & Quality Measures: 1. Aspirin prescribed? - Yes 2. ADP Receptor Inhibitor (Plavix/Clopidogrel, Brilinta/Ticagrelor or Effient/Prasugrel) prescribed (includes medically managed patients)? - Yes 3. Beta Blocker prescribed? - Yes 4. High Intensity Statin (Lipitor 40-80mg  or Crestor 20-40mg ) prescribed? - Yes 5. EF assessed during THIS hospitalization? - Yes 6. For EF <40%, was ACEI/ARB prescribed? - Not Applicable (EF >/= 40%) 7. For EF <40%, Aldosterone Antagonist (Spironolactone or Eplerenone) prescribed? - Not Applicable (EF >/= 40%) 8. Cardiac Rehab Phase II ordered (including medically managed patients)? - Yes   _____________  Discharge Vitals Blood pressure 101/76, pulse 69, temperature 98.5 F (36.9 C), temperature source Oral, resp. rate 18, height 5\' 9"  (1.753 m), weight 76.5 kg, SpO2 95 %.  Filed Weights   12/25/19 1400 12/25/19 1431 12/26/19 0526  Weight: 74.8 kg 74.8 kg 76.5 kg    Labs & Radiologic Studies    CBC Recent Labs    12/25/19 1450 12/25/19 1450 12/26/19 0032 12/27/19 0200  WBC 8.8   < > 8.2 7.8  NEUTROABS 7.0  --   --   --   HGB 13.3   14.3   < > 14.1 14.4  HCT 39.0   40.3   < > 41.0 42.0  MCV 91.4   < > 93.4 95.2  PLT 185   < > 210 198   < > = values in this interval not displayed.   Basic Metabolic Panel Recent Labs    12/29/19 0032 12/27/19 0200  NA 136 139  K 4.4 4.3  CL 106 107  CO2 23 22  GLUCOSE 134* 96  BUN 18 15  CREATININE 1.06 0.99  CALCIUM 8.6* 8.6*   Liver Function Tests Recent Labs    12/25/19 1450 12/26/19 0032  AST 28 112*  ALT 23 31  ALKPHOS 50 45  BILITOT 0.6 0.9  PROT 5.9* 5.6*  ALBUMIN 3.6 3.3*   No results for input(s): LIPASE, AMYLASE in the last 72 hours. High Sensitivity Troponin:   Recent Labs  Lab 12/25/19 1450 12/25/19 1611 12/25/19 2017  TROPONINIHS 15 9,333* 24,320*    BNP Invalid input(s):  POCBNP D-Dimer No results for input(s): DDIMER in the last 72 hours. Hemoglobin A1C Recent Labs    12/25/19 1450  HGBA1C 5.4   Fasting Lipid Panel Recent Labs    12/25/19 1450  CHOL 182  HDL 34*  LDLCALC 115*  TRIG 164*  CHOLHDL 5.4   Thyroid Function Tests No results for input(s): TSH, T4TOTAL, T3FREE, THYROIDAB in the last 72 hours.  Invalid input(s): FREET3 _____________  CARDIAC CATHETERIZATION  Result Date: 12/26/2019  Mid LAD lesion is 80% stenosed.  Prox Cx to Mid Cx lesion is 20% stenosed.  Previously placed Mid RCA drug eluting stent is widely patent.  Balloon angioplasty was performed.  A drug-eluting stent was successfully placed using a SYNERGY XD 2.50X20.  Post  intervention, there is a 0% residual stenosis.  1. Patent RCA with patent mid stent 2. Severe stenosis mid LAD 3. Successful PTCA/DES x 1 mid LAD Recommendations: Continue ASA and Brilinta for one year. Continue statin. No beta blocker with bradycardia. Probable discharge home tomorrow. He will be taken back to the ICU as there are no beds in our holding area or on telemetry units.   CARDIAC CATHETERIZATION  Result Date: 12/25/2019  Mid RCA lesion is 100% stenosed.  Prox Cx to Mid Cx lesion is 20% stenosed.  Mid LAD lesion is 80% stenosed.  A drug-eluting stent was successfully placed using a SYNERGY XD 3.0X16.  Post intervention, there is a 0% residual stenosis.  1. Acute inferior STEMI secondary to thrombotic occlusion of the mid RCA 2. Successful PTCA/DES x 1 mid RCA 3. Severe stenosis mid LAD Recommendations: Continue Aggrastat for 2 hours. Will continue DAPT with ASA and Brilinta for one year. Will start a high intensity statin. Will start a beta blocker tomorrow if BP tolerates. Echo tomorrow. I will plan a staged PCI of the LAD tomorrow if he remains stable.   DG Chest Port 1 View  Result Date: 12/25/2019 CLINICAL DATA:  54 year old male with chest pain. EXAM: PORTABLE CHEST 1 VIEW  COMPARISON:  None. FINDINGS: No focal consolidation, pleural effusion, pneumothorax. The cardiac silhouette is within limits. Atherosclerotic calcification of the aorta. No acute osseous pathology. Partially visualized lower cervical ACDF. IMPRESSION: No active disease. Electronically Signed   By: Elgie Collard M.D.   On: 12/25/2019 18:05   ECHOCARDIOGRAM COMPLETE  Result Date: 12/26/2019    ECHOCARDIOGRAM REPORT   Patient Name:   Marvin Velasquez Date of Exam: 12/26/2019 Medical Rec #:  [id number]    Height:       69.0 in Accession #:    [id number]   Weight:       168.7 lb Date of Birth:  [birth year]   BSA:          1.922 m Patient Age:    53 years     BP:           131/77 mmHg Patient Gender: M            HR:           53 bpm. Exam Location:  Inpatient Procedure: 2D Echo Indications:    cad of native vessel 414.01  History:        Patient has no prior history of Echocardiogram examinations.  Sonographer:    Delcie Roch Referring Phys: [id number] CHRISTOPHER D MCALHANY IMPRESSIONS  1. Left ventricular ejection fraction, by estimation, is 55 to 60%. The left ventricle has normal function. The left ventricle has no regional wall motion abnormalities. Left ventricular diastolic parameters were normal.  2. Right ventricular systolic function is normal. The right ventricular size is normal. Tricuspid regurgitation signal is inadequate for assessing PA pressure.  3. The mitral valve is grossly normal. Trivial mitral valve regurgitation. No evidence of mitral stenosis.  4. The aortic valve is tricuspid. Aortic valve regurgitation is not visualized. No aortic stenosis is present.  5. The inferior vena cava is normal in size with greater than 50% respiratory variability, suggesting right atrial pressure of 3 mmHg. Conclusion(s)/Recommendation(s): Normal biventricular function without evidence of hemodynamically significant valvular heart disease. FINDINGS  Left Ventricle: Left ventricular ejection fraction, by  estimation, is 55 to 60%. The left ventricle has normal function. The left ventricle has no regional wall motion abnormalities. The left  ventricular internal cavity size was normal in size. There is  no left ventricular hypertrophy. Left ventricular diastolic parameters were normal. Right Ventricle: The right ventricular size is normal. No increase in right ventricular wall thickness. Right ventricular systolic function is normal. Tricuspid regurgitation signal is inadequate for assessing PA pressure. Left Atrium: Left atrial size was normal in size. Right Atrium: Right atrial size was normal in size. Pericardium: Trivial pericardial effusion is present. Presence of pericardial fat pad. Mitral Valve: The mitral valve is grossly normal. Trivial mitral valve regurgitation. No evidence of mitral valve stenosis. Tricuspid Valve: The tricuspid valve is grossly normal. Tricuspid valve regurgitation is trivial. No evidence of tricuspid stenosis. Aortic Valve: The aortic valve is tricuspid. Aortic valve regurgitation is not visualized. No aortic stenosis is present. Pulmonic Valve: The pulmonic valve was grossly normal. Pulmonic valve regurgitation is not visualized. No evidence of pulmonic stenosis. Aorta: The aortic root and ascending aorta are structurally normal, with no evidence of dilitation. Venous: The inferior vena cava is normal in size with greater than 50% respiratory variability, suggesting right atrial pressure of 3 mmHg. IAS/Shunts: The atrial septum is grossly normal.  LEFT VENTRICLE PLAX 2D LVIDd:         4.80 cm  Diastology LVIDs:         3.50 cm  LV e' medial:    10.60 cm/s LV PW:         1.10 cm  LV E/e' medial:  6.9 LV IVS:        1.10 cm  LV e' lateral:   12.20 cm/s LVOT diam:     1.90 cm  LV E/e' lateral: 6.0 LV SV:         82 LV SV Index:   43 LVOT Area:     2.84 cm  RIGHT VENTRICLE             IVC RV S prime:     14.70 cm/s  IVC diam: 1.80 cm TAPSE (M-mode): 2.8 cm LEFT ATRIUM             Index        RIGHT ATRIUM           Index LA diam:        3.60 cm 1.87 cm/m  RA Area:     14.40 cm LA Vol (A2C):   34.3 ml 17.85 ml/m RA Volume:   35.00 ml  18.21 ml/m LA Vol (A4C):   42.6 ml 22.17 ml/m LA Biplane Vol: 39.0 ml 20.30 ml/m  AORTIC VALVE LVOT Vmax:   140.00 cm/s LVOT Vmean:  82.500 cm/s LVOT VTI:    0.289 m  AORTA Ao Root diam: 3.40 cm Ao Asc diam:  3.30 cm MITRAL VALVE MV Area (PHT): 2.62 cm    SHUNTS MV Decel Time: 290 msec    Systemic VTI:  0.29 m MV E velocity: 73.30 cm/s  Systemic Diam: 1.90 cm MV A velocity: 76.70 cm/s MV E/A ratio:  0.96 Lennie Odor MD Electronically signed by Lennie Odor MD Signature Date/Time: 12/26/2019/10:26:10 AM    Final    Disposition   Pt is being discharged home today in improved condition.  Follow-up Plans & Appointments     Follow-up Information    Kathleene Hazel, MD Follow up.   Specialty: Cardiology Why: The office will call. Contact information: [address] [place] [address] [phone]              Discharge  Instructions    Amb Referral to Cardiac Rehabilitation   Complete by: As directed    Diagnosis:  Coronary Stents STEMI     After initial evaluation and assessments completed: Virtual Based Care may be provided alone or in conjunction with Phase 2 Cardiac Rehab based on patient barriers.: Yes   Diet - low sodium heart healthy   Complete by: As directed    Increase activity slowly   Complete by: As directed       Discharge Medications   Allergies as of 12/28/2019   No Known Allergies     Medication List    TAKE these medications   aspirin 81 MG chewable tablet Chew 1 tablet (81 mg total) by mouth daily. Start taking on: December 29, 2019   atorvastatin 80 MG tablet Commonly known as: LIPITOR Take 1 tablet (80 mg total) by mouth daily at 6 PM.   metoprolol tartrate 25 MG tablet Commonly known as: LOPRESSOR Take 0.5 tablets (12.5 mg total) by mouth 2 (two) times daily.    nitroGLYCERIN 0.4 MG SL tablet Commonly known as: Nitrostat Place 1 tablet (0.4 mg total) under the tongue every 5 (five) minutes as needed for chest pain.   pantoprazole 40 MG tablet Commonly known as: PROTONIX Take 1 tablet (40 mg total) by mouth daily. Start taking on: December 29, 2019   ticagrelor 90 MG Tabs tablet Commonly known as: BRILINTA Take 1 tablet (90 mg total) by mouth 2 (two) times daily.          Outstanding Labs/Studies   None  Duration of Discharge Encounter   Greater than 30 minutes including physician time.  Signed, Theodore Demark, PA-C 12/28/2019, 11:13 AM

## 2019-12-29 ENCOUNTER — Telehealth: Payer: Self-pay | Admitting: Cardiovascular Disease

## 2019-12-29 MED FILL — Nitroglycerin IV Soln 100 MCG/ML in D5W: INTRA_ARTERIAL | Qty: 10 | Status: AC

## 2019-12-29 NOTE — Telephone Encounter (Signed)
Pt c/o medication issue:  1. Name of Medication: pantoprazole (PROTONIX) 40 MG tablet  2. How are you currently taking this medication (dosage and times per day)? 1 tablet daily  3. Are you having a reaction (difficulty breathing--STAT)? no  4. What is your medication issue? Patient states he was given the medication in the hospital and would like to know if he needs to continue to take the medication.

## 2019-12-29 NOTE — Telephone Encounter (Signed)
**Note De-Identified Marvin Velasquez Obfuscation** Per Dr Karie Schwalbe nurse I have advised the pt to continue to take his Pantoprazole while we were discussing his TCM call. He verbalized understanding and thanked me for letting him know.

## 2019-12-29 NOTE — Telephone Encounter (Signed)
Patient has TOC appointment with Marvin Velasquez on 01/13/2020 at 11:15 am per staff message from [facility]

## 2019-12-29 NOTE — Telephone Encounter (Signed)
**Note De-Identified Marvin Velasquez Obfuscation** Patient contacted regarding discharge from [facility] on 12/28/2019.  Patient understands to follow up with provider Velora Mediate on 11/16/021 at 11:15 at [address] in [place], [place] [address]. Patient understands discharge instructions? Yes Patient understands medications and regiment? Yes Patient understands to bring all medications to this visit? Yes  Ask patient:  Are you enrolled in MyCHART: No. He states that he may but has not yet.  The pt states that he is doing "ok" and denies CP/discomfort, SOB, nausea, diaphoresis, dizziness, or headaches. He is aware to call us if he has any questions or concerns. He thanked me for calling him.

## 2020-01-01 ENCOUNTER — Telehealth (HOSPITAL_COMMUNITY): Payer: Self-pay | Admitting: General Practice

## 2020-01-06 ENCOUNTER — Telehealth: Payer: Self-pay | Admitting: Cardiovascular Disease

## 2020-01-06 NOTE — Telephone Encounter (Signed)
Attempted pt again and had same problem with call dropping.

## 2020-01-06 NOTE — Telephone Encounter (Signed)
Pt is calling in to move his TOC appt up for complaints of palpitations, feeling his heart "pounding." Pt was admitted on 10/28 with an inferior STEMI.  Pt received a cath and a stent was placed to the mid LAD to treat an 80% lesion.  Pt discharged on 10/31 with TOC appt arranged to see Georgie Chard NP on 11/16.  Pt is calling in today with complaints of feeling his "heart pounding" for the last 2 days.  Pt states his episodes are intermittent in nature.  He states he has no chest pain, radiating pain, sob, doe, dizziness, N/V, diaphoresis, pre-syncopal or syncopal episodes.  He noted his episodes occurred after eating lunch and before going to bed.   Pt is concerned about issue, especially given his recent admission with intervention.  Pt states he is taking all meds prescribed.  Pt has no BP/HR readings to provide me at this time.  Scheduled the pt to come in and see Dr. Clifton James tomorrow 11/10 at 1100.  Pt aware to arrive 15 mins prior to this appt.  Pt is aware of our office location.  Advised him to bring all his meds with him, and any questions about recent discharge, for this will also serve as a TOC appt. Cancelled his TOC appt scheduled on 11/16 with Georgie Chard NP.   ED precautions reviewed with the pt.  Informed the pt that I will route this message to Dr. Clifton James and his RN to make them aware of this plan. Pt verbalized understanding and agrees with this plan.  Pt was more than gracious for all the assistance provided.

## 2020-01-06 NOTE — Telephone Encounter (Signed)
Attempted to contact pt x 2.  Phone rings 1 time and then disconnects.

## 2020-01-06 NOTE — Telephone Encounter (Signed)
Patient returning call.

## 2020-01-06 NOTE — Telephone Encounter (Signed)
Patient c/o Palpitations:  High priority if patient c/o lightheadedness, shortness of breath, or chest pain  1) How long have you had palpitations/irregular HR/ Afib? Are you having the symptoms now? Palpitations, states his heart beats really hard out of nowhere.   2) Are you currently experiencing lightheadedness, SOB or CP? no  3) Do you have a history of afib (atrial fibrillation) or irregular heart rhythm? Says he had an irregular HR a few years ago but it went away.   4) Have you checked your BP or HR? (document readings if available): no  5) Are you experiencing any other symptoms? No   Patient had a stent put in on 10/29 but states that feels his heart is beating really hard out of nowhere. He states he can be sitting on the couch watching TV and it start. He wants to know if this is normal or if he should be concerned. Please advise.

## 2020-01-07 ENCOUNTER — Telehealth: Payer: Self-pay | Admitting: Radiology

## 2020-01-07 ENCOUNTER — Other Ambulatory Visit: Payer: Self-pay

## 2020-01-07 ENCOUNTER — Ambulatory Visit: Payer: BC Managed Care – PPO | Admitting: Cardiovascular Disease

## 2020-01-07 ENCOUNTER — Encounter: Payer: Self-pay | Admitting: Cardiovascular Disease

## 2020-01-07 VITALS — BP 98/68 | HR 58 | Ht 66.0 in | Wt 166.8 lb

## 2020-01-07 DIAGNOSIS — E7800 Pure hypercholesterolemia, unspecified: Secondary | ICD-10-CM | POA: Diagnosis not present

## 2020-01-07 DIAGNOSIS — I2510 Atherosclerotic heart disease of native coronary artery without angina pectoris: Secondary | ICD-10-CM

## 2020-01-07 DIAGNOSIS — R002 Palpitations: Secondary | ICD-10-CM

## 2020-01-07 NOTE — Patient Instructions (Addendum)
Medication Instructions:  No changes today *If you need a refill on your cardiac medications before your next appointment, please call your pharmacy*   Lab Work: In 2 months, fasting lipids, liver function  If you have labs (blood work) drawn today and your tests are completely normal, you will receive your results only by: Marland Kitchen MyChart Message (if you have MyChart) OR . A paper copy in the mail If you have any lab test that is abnormal or we need to change your treatment, we will call you to review the results.   Testing/Procedures: Christena Deem- Long Term Monitor Instructions   Your physician has requested you wear your ZIO patch monitor forTHREE days--72 hours.   This is a single patch monitor.  [employer] supplies one patch monitor per enrollment.  Additional stickers are not available.   Please do not apply patch if you will be having a Nuclear Stress Test, Echocardiogram, Cardiac CT, MRI, or Chest Xray during the time frame you would be wearing the monitor. The patch cannot be worn during these tests.  You cannot remove and re-apply the ZIO XT patch monitor.   Your ZIO patch monitor will be sent USPS Priority mail from [facility] directly to your home address. The monitor may also be mailed to a PO BOX if home delivery is not available.   It may take 3-5 days to receive your monitor after you have been enrolled.   Once you have received you monitor, please review enclosed instructions.  Your monitor has already been registered assigning a specific monitor serial # to you.   Applying the monitor   Shave hair from upper left chest.   Hold abrader disc by orange tab.  Rub abrader in 40 strokes over left upper chest as indicated in your monitor instructions.   Clean area with 4 enclosed alcohol pads .  Use all pads to assure are is cleaned thoroughly.  Let dry.   Apply patch as indicated in monitor instructions.  Patch will be place under collarbone on left side of chest with arrow  pointing upward.   Rub patch adhesive wings for 2 minutes.Remove white label marked "1".  Remove white label marked "2".  Rub patch adhesive wings for 2 additional minutes.   While looking in a mirror, press and release button in center of patch.  A small green light will flash 3-4 times .  This will be your only indicator the monitor has been turned on.     Do not shower for the first 24 hours.  You may shower after the first 24 hours.   Press button if you feel a symptom. You will hear a small click.  Record Date, Time and Symptom in the Patient Log Book.   When you are ready to remove patch, follow instructions on last 2 pages of Patient Log Book.  Stick patch monitor onto last page of Patient Log Book.   Place Patient Log Book in [place] box.  Use locking tab on box and tape box closed securely.  The [employer] and [employer] has [employer] on it.  Please place in mailbox as soon as possible.  Your physician should have your test results approximately 7 days after the monitor has been mailed back to [facility].   Call [facility] Customer Care at [phone] if you have questions regarding your ZIO XT patch monitor.  Call them immediately if you see an orange light blinking on your monitor.   If your monitor falls  off in less than 4 days contact our Monitor department at [phone].  If your monitor becomes loose or falls off after 4 days call [employer] at [phone] for suggestions on securing your monitor.    Follow-Up: At [facility], you and your health needs are our priority.  As part of our continuing mission to provide you with exceptional heart care, we have created designated Provider Care Teams.  These Care Teams include your primary Cardiologist (physician) and Advanced Practice Providers (APPs -  Physician Assistants and Nurse Practitioners) who all work together to provide you with the care you need, when you need it.  Your next appointment:   3 month(s)  The  format for your next appointment:   In Person  Provider:   You may see Verne Carrow, MD or one of the following Advanced Practice Providers on your designated Care Team:    Ronie Spies, PA-C  Jacolyn Reedy, PA-C   Other Instructions

## 2020-01-07 NOTE — Progress Notes (Signed)
Chief Complaint  Patient presents with  . Follow-up    CAD   History of Present Illness: 54 yo male with history of CAD here today for cardiac follow up. He was admitted to [facility] 12/25/19 with an acute inferior STEMI. Cardiac cath with thrombotic occlusion of the mid RCA, treated with a drug eluting stent. Also with severe mid LAD disease treated in a staged procedure on 11/2919 with a drug eluting stent. Echo 12/26/19 with LVEF=55-60%. No valve disease. Discharged on ASA, Brilinta, statin and beta blocker.   He is here today for follow up. The patient denies any chest pain, dyspnea, lower extremity edema, orthopnea, PND, dizziness, near syncope or syncope. He has felt his heart "pounding" at times over the past two days. His heart is not racing. It feels like heavy beats.   Primary Care Physician: Patient, No Pcp Per   Past Medical History:  Diagnosis Date  . Back pain   . CAD (coronary artery disease)     Past Surgical History:  Procedure Laterality Date  . BACK SURGERY    . CORONARY STENT INTERVENTION N/A 12/26/2019   Procedure: CORONARY STENT INTERVENTION;  Surgeon: Kathleene Hazel, MD;  Location: [facility] INVASIVE CV LAB;  Service: Cardiovascular;  Laterality: N/A;  . CORONARY/GRAFT ACUTE MI REVASCULARIZATION N/A 12/25/2019   Procedure: CORONARY/GRAFT ACUTE MI REVASCULARIZATION;  Surgeon: Kathleene Hazel, MD;  Location: [facility] INVASIVE CV LAB;  Service: Cardiovascular;  Laterality: N/A;    Current Outpatient Medications  Medication Sig Dispense Refill  . aspirin EC 81 MG tablet Take 81 mg by mouth daily. Swallow whole.    Marland Kitchen atorvastatin (LIPITOR) 80 MG tablet Take 1 tablet (80 mg total) by mouth daily at 6 PM. 30 tablet 11  . metoprolol tartrate (LOPRESSOR) 25 MG tablet Take 0.5 tablets (12.5 mg total) by mouth 2 (two) times daily. 33 tablet 11  . Multiple Vitamins-Minerals (MULTIVITAMIN ADULTS 50+ PO) Take 1 tablet by mouth daily.    . nitroGLYCERIN  (NITROSTAT) 0.4 MG SL tablet Place 1 tablet (0.4 mg total) under the tongue every 5 (five) minutes as needed for chest pain. 25 tablet 3  . pantoprazole (PROTONIX) 40 MG tablet Take 1 tablet (40 mg total) by mouth daily. 30 tablet 11  . ticagrelor (BRILINTA) 90 MG TABS tablet Take 1 tablet (90 mg total) by mouth 2 (two) times daily. 60 tablet 11   No current facility-administered medications for this visit.    No Known Allergies  Social History   Socioeconomic History  . Marital status: Married    Spouse name: Not on file  . Number of children: Not on file  . Years of education: Not on file  . Highest education level: Not on file  Occupational History  . Not on file  Tobacco Use  . Smoking status: Former Smoker    Packs/day: 2.00    Years: 30.00    Pack years: 60.00    Types: Cigarettes  . Smokeless tobacco: Former Engineer, water and Sexual Activity  . Alcohol use: Not on file  . Drug use: Not on file  . Sexual activity: Not on file  Other Topics Concern  . Not on file  Social History Narrative  . Not on file   Social Determinants of Health   Financial Resource Strain:   . Difficulty of Paying Living Expenses: Not on file  Food Insecurity:   . Worried About Programme researcher, broadcasting/film/video in the Last Year: Not on file  .  Ran Out of Food in the Last Year: Not on file  Transportation Needs:   . Lack of Transportation (Medical): Not on file  . Lack of Transportation (Non-Medical): Not on file  Physical Activity:   . Days of Exercise per Week: Not on file  . Minutes of Exercise per Session: Not on file  Stress:   . Feeling of Stress : Not on file  Social Connections:   . Frequency of Communication with Friends and Family: Not on file  . Frequency of Social Gatherings with Friends and Family: Not on file  . Attends Religious Services: Not on file  . Active Member of Clubs or Organizations: Not on file  . Attends Banker Meetings: Not on file  . Marital Status: Not  on file  Intimate Partner Violence:   . Fear of Current or Ex-Partner: Not on file  . Emotionally Abused: Not on file  . Physically Abused: Not on file  . Sexually Abused: Not on file    History reviewed. No pertinent family history.  Review of Systems:  As stated in the HPI and otherwise negative.   BP 98/68   Pulse (!) 58   Ht 5\' 6"  (1.676 m)   Wt 166 lb 12.8 oz (75.7 kg)   SpO2 99%   BMI 26.92 kg/m   Physical Examination: General: Well developed, well nourished, NAD  HEENT: OP clear, mucus membranes moist  SKIN: warm, dry. No rashes. Neuro: No focal deficits  Musculoskeletal: Muscle strength 5/5 all ext  Psychiatric: Mood and affect normal  Neck: No JVD, no carotid bruits, no thyromegaly, no lymphadenopathy.  Lungs:Clear bilaterally, no wheezes, rhonci, crackles Cardiovascular: Regular rate and rhythm. No murmurs, gallops or rubs. Abdomen:Soft. Bowel sounds present. Non-tender.  Extremities: No lower extremity edema. Pulses are 2 + in the bilateral DP/PT.  EKG:  EKG is ordered today. The ekg ordered today demonstrates sinus  Echo 12/26/19:  1. Left ventricular ejection fraction, by estimation, is 55 to 60%. The  left ventricle has normal function. The left ventricle has no regional  wall motion abnormalities. Left ventricular diastolic parameters were  normal.  2. Right ventricular systolic function is normal. The right ventricular  size is normal. Tricuspid regurgitation signal is inadequate for assessing  PA pressure.  3. The mitral valve is grossly normal. Trivial mitral valve  regurgitation. No evidence of mitral stenosis.  4. The aortic valve is tricuspid. Aortic valve regurgitation is not  visualized. No aortic stenosis is present.  5. The inferior vena cava is normal in size with greater than 50%  respiratory variability, suggesting right atrial pressure of 3 mmHg.   Conclusion(s)/Recommendation(s): Normal biventricular function without  evidence of  hemodynamically significant valvular heart disease.   FINDINGS  Left Ventricle: Left ventricular ejection fraction, by estimation, is 55  to 60%. The left ventricle has normal function. The left ventricle has no  regional wall motion abnormalities. The left ventricular internal cavity  size was normal in size. There is  no left ventricular hypertrophy. Left ventricular diastolic parameters  were normal.   Recent Labs: 12/26/2019: ALT 31 12/27/2019: BUN 15; Creatinine, Ser 0.99; Hemoglobin 14.4; Platelets 198; Potassium 4.3; Sodium 139   Lipid Panel    Component Value Date/Time   CHOL 182 12/25/2019 1450   TRIG 164 (H) 12/25/2019 1450   HDL 34 (L) 12/25/2019 1450   CHOLHDL 5.4 12/25/2019 1450   VLDL 33 12/25/2019 1450   LDLCALC 115 (H) 12/25/2019 1450  Wt Readings from Last 3 Encounters:  01/07/20 166 lb 12.8 oz (75.7 kg)  12/26/19 168 lb 10.4 oz (76.5 kg)       Assessment and Plan:   1. CAD without angina: He has no chest pain. Will continue ASA and Brilinta for one year post MI. Continue statin and beta blocker.   2. Hyperlipidemia: Continue statin therapy. Repeat lipids and LFTS in 2 months.   3. Palpitations: Will arrange a 3 day monitor. EKG today with sinus rhtyhm  Current medicines are reviewed at length with the patient today.  The patient does not have concerns regarding medicines.  The following changes have been made:  no change  Labs/ tests ordered today include:   Orders Placed This Encounter  Procedures  . Hepatic function panel  . Lipid panel  . LONG TERM MONITOR (3-14 DAYS)  . EKG 12-Lead     Disposition:   FU with me in 3 months.    Signed, Verne Carrow, MD 01/07/2020 12:06 PM    [facility] [address] [place], [place], [place]  [address] Phone: [phone]; Fax: [phone]

## 2020-01-07 NOTE — Telephone Encounter (Signed)
Enrolled patient for a 3 day Zio XT monitor to be mailed to patients home  

## 2020-01-10 ENCOUNTER — Other Ambulatory Visit (INDEPENDENT_AMBULATORY_CARE_PROVIDER_SITE_OTHER): Payer: BC Managed Care – PPO

## 2020-01-10 DIAGNOSIS — R002 Palpitations: Secondary | ICD-10-CM | POA: Diagnosis not present

## 2020-01-13 ENCOUNTER — Ambulatory Visit: Payer: BC Managed Care – PPO | Admitting: Cardiology

## 2020-01-23 DIAGNOSIS — R002 Palpitations: Secondary | ICD-10-CM | POA: Diagnosis not present

## 2020-01-26 ENCOUNTER — Telehealth (HOSPITAL_COMMUNITY): Payer: Self-pay

## 2020-01-26 NOTE — Telephone Encounter (Signed)
Called and spoke with pt in regards to CR, pt stated he would like to attend the location at [facility], will send referral to [facility].

## 2020-01-29 ENCOUNTER — Telehealth: Payer: Self-pay | Admitting: Cardiovascular Disease

## 2020-01-29 NOTE — Telephone Encounter (Signed)
Left message for patient to call back  

## 2020-01-29 NOTE — Telephone Encounter (Signed)
Patient is returning call to discuss monitor results. 

## 2020-01-29 NOTE — Telephone Encounter (Signed)
Pt is returning call.  

## 2020-01-29 NOTE — Telephone Encounter (Signed)
Called and reviewed monitor results with patient. He said that he is unable to attend cardiac rehab due to his schedule but wants to make sure he is okay to exercise on his own.  I adv that there was no information in last office visit note that indicates he should not.  Adv to take activities as tolerated and increase slowly.

## 2020-03-08 ENCOUNTER — Other Ambulatory Visit: Payer: Self-pay

## 2020-03-08 ENCOUNTER — Other Ambulatory Visit: Payer: BC Managed Care – PPO | Admitting: *Deleted

## 2020-03-08 DIAGNOSIS — E7800 Pure hypercholesterolemia, unspecified: Secondary | ICD-10-CM | POA: Diagnosis not present

## 2020-03-08 DIAGNOSIS — I2510 Atherosclerotic heart disease of native coronary artery without angina pectoris: Secondary | ICD-10-CM

## 2020-03-08 LAB — HEPATIC FUNCTION PANEL
ALT: 32 IU/L (ref 0–44)
AST: 30 IU/L (ref 0–40)
Albumin: 4.7 g/dL (ref 3.8–4.9)
Alkaline Phosphatase: 86 IU/L (ref 44–121)
Bilirubin Total: 0.5 mg/dL (ref 0.0–1.2)
Bilirubin, Direct: 0.16 mg/dL (ref 0.00–0.40)
Total Protein: 6.7 g/dL (ref 6.0–8.5)

## 2020-03-08 LAB — LIPID PANEL
Chol/HDL Ratio: 2.6 ratio (ref 0.0–5.0)
Cholesterol, Total: 119 mg/dL (ref 100–199)
HDL: 45 mg/dL (ref >39)
LDL Chol Calc (NIH): 59 mg/dL (ref 0–99)
Triglycerides: 72 mg/dL (ref 0–149)
VLDL Cholesterol Cal: 15 mg/dL (ref 5–40)

## 2020-04-12 ENCOUNTER — Other Ambulatory Visit: Payer: Self-pay

## 2020-04-12 ENCOUNTER — Ambulatory Visit: Payer: BC Managed Care – PPO | Admitting: Cardiovascular Disease

## 2020-04-12 ENCOUNTER — Encounter: Payer: Self-pay | Admitting: Cardiovascular Disease

## 2020-04-12 VITALS — BP 118/64 | HR 65 | Ht 66.0 in | Wt 164.4 lb

## 2020-04-12 DIAGNOSIS — E7800 Pure hypercholesterolemia, unspecified: Secondary | ICD-10-CM | POA: Diagnosis not present

## 2020-04-12 DIAGNOSIS — I25119 Atherosclerotic heart disease of native coronary artery with unspecified angina pectoris: Secondary | ICD-10-CM | POA: Diagnosis not present

## 2020-04-12 DIAGNOSIS — R002 Palpitations: Secondary | ICD-10-CM

## 2020-04-12 MED ORDER — CLOPIDOGREL BISULFATE 75 MG PO TABS: 75.0000 mg | ORAL_TABLET | Freq: Every day | ORAL | 3 refills | Status: DC

## 2020-04-12 NOTE — Progress Notes (Signed)
Chief Complaint  Patient presents with  . Follow-up    CAD   History of Present Illness: 55 yo male with history of CAD here today for cardiac follow up. He was admitted to [facility] 12/25/19 with an acute inferior STEMI. Cardiac cath with thrombotic occlusion of the mid RCA, treated with a drug eluting stent. Also with severe mid LAD disease treated in a staged procedure on 11/2919 with a drug eluting stent. Echo 12/26/19 with LVEF=55-60%. No valve disease. Discharged on ASA, Brilinta, statin and beta blocker. Cardiac monitor November 2021 with PACs, PVCs and 4 beat run of SVT.   He is here today for follow up. He has rare exertional chest pressure and dyspnea. This is after his Brilinta on some mornings. The patient denies any palpitations, lower extremity edema, orthopnea, PND, dizziness, near syncope or syncope.   Primary Care Physician: Patient, No Pcp Per  Past Medical History:  Diagnosis Date  . Back pain   . CAD (coronary artery disease)     Past Surgical History:  Procedure Laterality Date  . BACK SURGERY    . CORONARY STENT INTERVENTION N/A 12/26/2019   Procedure: CORONARY STENT INTERVENTION;  Surgeon: Kathleene Hazel, MD;  Location: [facility] INVASIVE CV LAB;  Service: Cardiovascular;  Laterality: N/A;  . CORONARY/GRAFT ACUTE MI REVASCULARIZATION N/A 12/25/2019   Procedure: CORONARY/GRAFT ACUTE MI REVASCULARIZATION;  Surgeon: Kathleene Hazel, MD;  Location: [facility] INVASIVE CV LAB;  Service: Cardiovascular;  Laterality: N/A;    Current Outpatient Medications  Medication Sig Dispense Refill  . aspirin EC 81 MG tablet Take 81 mg by mouth daily. Swallow whole.    Marland Kitchen atorvastatin (LIPITOR) 80 MG tablet Take 1 tablet (80 mg total) by mouth daily at 6 PM. 30 tablet 11  . [START ON 04/13/2020] clopidogrel (PLAVIX) 75 MG tablet Take 1 tablet (75 mg total) by mouth daily. 90 tablet 3  . metoprolol tartrate (LOPRESSOR) 25 MG tablet Take 0.5 tablets (12.5 mg total) by mouth 2  (two) times daily. 33 tablet 11  . Multiple Vitamins-Minerals (MULTIVITAMIN ADULTS 50+ PO) Take 1 tablet by mouth daily.    . nitroGLYCERIN (NITROSTAT) 0.4 MG SL tablet Place 1 tablet (0.4 mg total) under the tongue every 5 (five) minutes as needed for chest pain. 25 tablet 3  . pantoprazole (PROTONIX) 40 MG tablet Take 1 tablet (40 mg total) by mouth daily. 30 tablet 11   No current facility-administered medications for this visit.    No Known Allergies  Social History   Socioeconomic History  . Marital status: Married    Spouse name: Not on file  . Number of children: Not on file  . Years of education: Not on file  . Highest education level: Not on file  Occupational History  . Not on file  Tobacco Use  . Smoking status: Former Smoker    Packs/day: 2.00    Years: 30.00    Pack years: 60.00    Types: Cigarettes  . Smokeless tobacco: Former Engineer, water and Sexual Activity  . Alcohol use: Not on file  . Drug use: Not on file  . Sexual activity: Not on file  Other Topics Concern  . Not on file  Social History Narrative  . Not on file   Social Determinants of Health   Financial Resource Strain: Not on file  Food Insecurity: Not on file  Transportation Needs: Not on file  Physical Activity: Not on file  Stress: Not on file  Social  Connections: Not on file  Intimate Partner Violence: Not on file    History reviewed. No pertinent family history.  Review of Systems:  As stated in the HPI and otherwise negative.   BP 118/64   Pulse 65   Ht 5\' 6"  (1.676 m)   Wt 164 lb 6.4 oz (74.6 kg)   SpO2 99%   BMI 26.53 kg/m   Physical Examination:  General: Well developed, well nourished, NAD  HEENT: OP clear, mucus membranes moist  SKIN: warm, dry. No rashes. Neuro: No focal deficits  Musculoskeletal: Muscle strength 5/5 all ext  Psychiatric: Mood and affect normal  Neck: No JVD, no carotid bruits, no thyromegaly, no lymphadenopathy.  Lungs:Clear bilaterally, no  wheezes, rhonci, crackles Cardiovascular: Regular rate and rhythm. No murmurs, gallops or rubs. Abdomen:Soft. Bowel sounds present. Non-tender.  Extremities: No lower extremity edema. Pulses are 2 + in the bilateral DP/PT.  EKG:  EKG is not ordered today. The ekg ordered today demonstrates   Echo 12/26/19: 1. Left ventricular ejection fraction, by estimation, is 55 to 60%. The  left ventricle has normal function. The left ventricle has no regional  wall motion abnormalities. Left ventricular diastolic parameters were  normal.  2. Right ventricular systolic function is normal. The right ventricular  size is normal. Tricuspid regurgitation signal is inadequate for assessing  PA pressure.  3. The mitral valve is grossly normal. Trivial mitral valve  regurgitation. No evidence of mitral stenosis.  4. The aortic valve is tricuspid. Aortic valve regurgitation is not  visualized. No aortic stenosis is present.  5. The inferior vena cava is normal in size with greater than 50%  respiratory variability, suggesting right atrial pressure of 3 mmHg.   Conclusion(s)/Recommendation(s): Normal biventricular function without  evidence of hemodynamically significant valvular heart disease.   FINDINGS  Left Ventricle: Left ventricular ejection fraction, by estimation, is 55  to 60%. The left ventricle has normal function. The left ventricle has no  regional wall motion abnormalities. The left ventricular internal cavity  size was normal in size. There is  no left ventricular hypertrophy. Left ventricular diastolic parameters  were normal.   Recent Labs: 12/27/2019: BUN 15; Creatinine, Ser 0.99; Hemoglobin 14.4; Platelets 198; Potassium 4.3; Sodium 139 03/08/2020: ALT 32   Lipid Panel    Component Value Date/Time   CHOL 119 03/08/2020 0849   TRIG 72 03/08/2020 0849   HDL 45 03/08/2020 0849   CHOLHDL 2.6 03/08/2020 0849   CHOLHDL 5.4 12/25/2019 1450   VLDL 33 12/25/2019 1450    LDLCALC 59 03/08/2020 0849     Wt Readings from Last 3 Encounters:  04/12/20 164 lb 6.4 oz (74.6 kg)  01/07/20 166 lb 12.8 oz (75.7 kg)  12/26/19 168 lb 10.4 oz (76.5 kg)    Assessment and Plan:   1. CAD with angina: He has had atypical chest pain at times with exertion. This does not sound cardiac related. I will arrange an exercise stress test to exclude ischemia. Continue ASA, statin and beta blocker. I will stop Brilinta and start Plavix 75 mg daily given dyspnea and chest pressure. If he continues to have chest pain, can consider adding Imdur.   2. Hyperlipidemia: LDL at goal. Continue statin  3. Palpitations/PACs/PVCs: No recent palpitations. Continue beta blocker.   Current medicines are reviewed at length with the patient today.  The patient does not have concerns regarding medicines.  The following changes have been made:  no change  Labs/ tests ordered today include:  Orders Placed This Encounter  Procedures  . EXERCISE TOLERANCE TEST (ETT)     Disposition:   FU with me in 3 months.    Signed, Verne Carrow, MD 04/12/2020 11:11 AM    [facility] [address] [place], [place], [place]  [address] Phone: [phone]; Fax: [phone]

## 2020-04-12 NOTE — Patient Instructions (Signed)
Medication Instructions:  Your physician has recommended you make the following change in your medication:  1.) after today STOP Brilinta 2.) tomorrow - start clopidogrel (Plavix) 75 mg - ONE tablet daily  *If you need a refill on your cardiac medications before your next appointment, please call your pharmacy*   Lab Work: none  Testing/Procedures: Your physician has requested that you have an exercise tolerance test. For further information please visit [email or web address] Please also follow instruction sheet, as given.   Follow-Up: At [facility], you and your health needs are our priority.  As part of our continuing mission to provide you with exceptional heart care, we have created designated Provider Care Teams.  These Care Teams include your primary Cardiologist (physician) and Advanced Practice Providers (APPs -  Physician Assistants and Nurse Practitioners) who all work together to provide you with the care you need, when you need it.   Your next appointment:   3 month(s)  The format for your next appointment:   In Person  Provider:   You may see Verne Carrow, MD or one of the following Advanced Practice Providers on your designated Care Team:    Ronie Spies, PA-C  Jacolyn Reedy, PA-C    Other Instructions

## 2020-05-01 ENCOUNTER — Other Ambulatory Visit (HOSPITAL_COMMUNITY): Payer: BC Managed Care – PPO

## 2020-05-19 DIAGNOSIS — J0110 Acute frontal sinusitis, unspecified: Secondary | ICD-10-CM | POA: Diagnosis not present

## 2020-06-11 ENCOUNTER — Other Ambulatory Visit (HOSPITAL_COMMUNITY)
Admission: RE | Admit: 2020-06-11 | Discharge: 2020-06-11 | Disposition: A | Discharge status: Home / Self Care | Payer: BC Managed Care – PPO | Source: Ambulatory Visit | Attending: Cardiovascular Disease | Admitting: Cardiovascular Disease

## 2020-06-11 DIAGNOSIS — Z01812 Encounter for preprocedural laboratory examination: Secondary | ICD-10-CM | POA: Insufficient documentation

## 2020-06-11 DIAGNOSIS — Z20822 Contact with and (suspected) exposure to covid-19: Secondary | ICD-10-CM | POA: Insufficient documentation

## 2020-06-11 LAB — SARS CORONAVIRUS 2 (TAT 6-24 HRS): SARS Coronavirus 2: NEGATIVE

## 2020-06-14 ENCOUNTER — Ambulatory Visit (INDEPENDENT_AMBULATORY_CARE_PROVIDER_SITE_OTHER): Payer: BC Managed Care – PPO

## 2020-06-14 ENCOUNTER — Other Ambulatory Visit: Payer: Self-pay

## 2020-06-14 DIAGNOSIS — I25119 Atherosclerotic heart disease of native coronary artery with unspecified angina pectoris: Secondary | ICD-10-CM

## 2020-06-14 LAB — EXERCISE TOLERANCE TEST
Estimated workload: 14.2 METS
Exercise duration (min): 12 min
Exercise duration (sec): 30 s
MPHR: 166 {beats}/min
Peak HR: 133 {beats}/min
Percent HR: 80 %
RPE: 19
Rest HR: 60 {beats}/min

## 2020-07-11 ENCOUNTER — Encounter: Payer: Self-pay | Admitting: Physician Assistant

## 2020-07-12 NOTE — Progress Notes (Signed)
Cardiology Office Note    Date:  07/13/2020   ID:  Marvin Velasquez, DOB Jun 25, 1965, MRN [id number]  PCP:  Patient, No Pcp Per (Inactive)  Cardiologist:  Verne Carrow, MD  Electrophysiologist:  None   Chief Complaint: f/u chest pain, SOB  History of Present Illness:   Marvin Velasquez is a 55 y.o. male with history of CAD (inferior STEMI 11/2019 s/p DES to RCA with staged PCI/DES to LAD, normal LVEF), HLD, PACs, PVCs, 4 beat run SVT by monitor 12/2019, prior back surgery here for follow-up. Studies reviewed below. At time of MI, 2D echo showed normal biventricular function without significant valvular heart disease. He saw Dr. Clifton James in February 2022 at which time he was reporting atypical chest pain and dyspnea. Brilinta was changed to Plavix. ETT showed good exercise tolerance (12:30) with mild chest pain towards end of exercise, did not achieve THR (80%), but not associated with any ischemic changes.  He is seen back for follow-up today overall doing well. He believes he is feeling better than previous OV. No further chest pain. Dyspnea improved since last OV. He is able to exercise 30 minutes a day and also is on his feet a lot during the day. He reports his stamina is still not where it was pre-MI, but seems to be better than before. He also reports he has a history of 15lb unintentional weight loss by home scale (previously 165-170 by home scale, but seeing weights in the 150s there). Weight is relatively stable by our records since he came into our care. No night sweats, hemoptysis or stigmata of GI bleeding. He also has noticed what sounds like intermittent Raynaud's phenomena of the finger tips 2-3x that resolved spontaneously. He has rare episodic palpitations, no sustained events and not specifically bothersome.  Labwork independently reviewed:  02/2020 LDL 59, LFTs wnl  11/2019 CBC wnl, K 4.3, Cr 0.99     Past Medical History:  Diagnosis Date  . Back pain   . CAD  (coronary artery disease)   . Premature atrial contractions   . PVC's (premature ventricular contractions)   . SVT (supraventricular tachycardia) (HCC)     Past Surgical History:  Procedure Laterality Date  . BACK SURGERY    . CORONARY STENT INTERVENTION N/A 12/26/2019   Procedure: CORONARY STENT INTERVENTION;  Surgeon: Kathleene Hazel, MD;  [facility]: [facility];  Service: Cardiovascular;  Laterality: N/A;  . CORONARY/GRAFT ACUTE MI REVASCULARIZATION N/A 12/25/2019   Procedure: CORONARY/GRAFT ACUTE MI REVASCULARIZATION;  Surgeon: Kathleene Hazel, MD;  Location: [facility];  Service: Cardiovascular;  Laterality: N/A;    Current Medications: Current Meds  Medication Sig  . aspirin EC 81 MG tablet Take 81 mg by mouth daily. Swallow whole.  Marland Kitchen atorvastatin (LIPITOR) 80 MG tablet Take 1 tablet (80 mg total) by mouth daily at 6 PM.  . clopidogrel (PLAVIX) 75 MG tablet Take 1 tablet (75 mg total) by mouth daily.  . metoprolol tartrate (LOPRESSOR) 25 MG tablet Take 0.5 tablets (12.5 mg total) by mouth 2 (two) times daily.  . Multiple Vitamins-Minerals (MULTIVITAMIN ADULTS 50+ PO) Take 1 tablet by mouth daily.  . nitroGLYCERIN (NITROSTAT) 0.4 MG SL tablet Place 1 tablet (0.4 mg total) under the tongue every 5 (five) minutes as needed for chest pain.      Allergies:   Patient has no known allergies.   Social History   Socioeconomic History  . Marital status: Married    Spouse  name: Not on file  . Number of children: Not on file  . Years of education: Not on file  . Highest education level: Not on file  Occupational History  . Not on file  Tobacco Use  . Smoking status: Former Smoker    Packs/day: 2.00    Years: 30.00    Pack years: 60.00    Types: Cigarettes  . Smokeless tobacco: Former Engineer, water and Sexual Activity  . Alcohol use: Not on file  . Drug use: Not on file  . Sexual activity: Not on file  Other Topics Concern  . Not on file   Social History Narrative  . Not on file   Social Determinants of Health   Financial Resource Strain: Not on file  Food Insecurity: Not on file  Transportation Needs: Not on file  Physical Activity: Not on file  Stress: Not on file  Social Connections: Not on file    Family History:  The patient's family history includes CAD in an other family member.  ROS:   Please see the history of present illness.  All other systems are reviewed and otherwise negative.    EKGs/Labs/Other Studies Reviewed:    Studies reviewed are outlined and summarized above. Reports included below if pertinent.  ETT 05/2020   Blood pressure demonstrated a normal response to exercise.   There was no ST segment deviation noted during stress.    ETT with good exercise tolerance (12:30); patient complained of mild chest pain towards the end of exercise relieved in recovery; normal blood pressure response; normal heart rate response; no ST changes; negative inadequate exercise treadmill (patient achieved 80% target heart rate).    Monitor 12/2019  Sinus rhythm  4 beat run of supraventricular tachycardia  Rare premature atrial contractions  Rare premature ventricular contractions    Cath 12/26/2019    Mid LAD lesion is 80% stenosed.   Prox Cx to Mid Cx lesion is 20% stenosed.   Previously placed Mid RCA drug eluting stent is widely patent.   Balloon angioplasty was performed.   A drug-eluting stent was successfully placed using a SYNERGY XD 2.50X20.   Post intervention, there is a 0% residual stenosis.    1. Patent RCA with patent mid stent  2. Severe stenosis mid LAD  3. Successful PTCA/DES x 1 mid LAD    Recommendations: Continue ASA and Brilinta for one year. Continue statin. No beta blocker with bradycardia. Probable discharge home tomorrow. He will be taken back to the ICU as there are no beds in our holding area or on telemetry units.    2D echo 12/25/19  IMPRESSIONS    1.  Left ventricular ejection fraction, by estimation, is 55 to 60%. The  left ventricle has normal function. The left ventricle has no regional  wall motion abnormalities. Left ventricular diastolic parameters were  normal.  2. Right ventricular systolic function is normal. The right ventricular  size is normal. Tricuspid regurgitation signal is inadequate for assessing  PA pressure.  3. The mitral valve is grossly normal. Trivial mitral valve  regurgitation. No evidence of mitral stenosis.  4. The aortic valve is tricuspid. Aortic valve regurgitation is not  visualized. No aortic stenosis is present.  5. The inferior vena cava is normal in size with greater than 50%  respiratory variability, suggesting right atrial pressure of 3 mmHg.   Conclusion(s)/Recommendation(s): Normal biventricular function without  evidence of hemodynamically significant valvular heart disease.   Cath 12/25/19  IMPRESSIONS  1. Left ventricular ejection fraction, by estimation, is 55 to 60%. The  left ventricle has normal function. The left ventricle has no regional  wall motion abnormalities. Left ventricular diastolic parameters were  normal.  2. Right ventricular systolic function is normal. The right ventricular  size is normal. Tricuspid regurgitation signal is inadequate for assessing  PA pressure.  3. The mitral valve is grossly normal. Trivial mitral valve  regurgitation. No evidence of mitral stenosis.  4. The aortic valve is tricuspid. Aortic valve regurgitation is not  visualized. No aortic stenosis is present.  5. The inferior vena cava is normal in size with greater than 50%  respiratory variability, suggesting right atrial pressure of 3 mmHg.   Conclusion(s)/Recommendation(s): Normal biventricular function without  evidence of hemodynamically significant valvular heart disease.        EKG:  EKG is not ordered today  Recent Labs: 12/27/2019: BUN 15; Creatinine, Ser 0.99;  Hemoglobin 14.4; Platelets 198; Potassium 4.3; Sodium 139 03/08/2020: ALT 32  Recent Lipid Panel    Component Value Date/Time   CHOL 119 03/08/2020 0849   TRIG 72 03/08/2020 0849   HDL 45 03/08/2020 0849   CHOLHDL 2.6 03/08/2020 0849   CHOLHDL 5.4 12/25/2019 1450   VLDL 33 12/25/2019 1450   LDLCALC 59 03/08/2020 0849    PHYSICAL EXAM:    VS:  BP 127/72   Pulse 64   Ht 5\' 6"  (1.676 m)   Wt 163 lb (73.9 kg)   SpO2 97%   BMI 26.31 kg/m   BMI: Body mass index is 26.31 kg/m.  GEN: Well nourished, well developed male in no acute distress HEENT: normocephalic, atraumatic Neck: no JVD, carotid bruits, or masses Cardiac: RRR; no murmurs, rubs, or gallops, no edema  Respiratory:  clear to auscultation bilaterally, normal work of breathing GI: soft, nontender, nondistended, + BS MS: no deformity or atrophy Skin: warm and dry, no rash, no evidence of embolic or ischemic phenomena on digits Neuro:  Alert and Oriented x 3, Strength and sensation are intact, follows commands Psych: euthymic mood, full affect  Wt Readings from Last 3 Encounters:  07/13/20 163 lb (73.9 kg)  04/12/20 164 lb 6.4 oz (74.6 kg)  01/07/20 166 lb 12.8 oz (75.7 kg)     ASSESSMENT & PLAN:   1. CAD - chest pain resolved. His dyspnea has improved. He still reports generalized stamina is down compared to pre-MI levels but has no concerning warning signs of accelerating angina with activity. Recent ETT was suboptimal due to [facility] not achieved but his exercise tolerance was excellent without any concerning EKG changes of ischemia. He is feeling better with switch from Brilinta to Plavix. We discussed continuing to advance his cardio regimen with his exercise routine and notifying for any accelerating symptoms. Continue ASA, BB, statin, Plavix. Will have him f/u in 6 months at which time the ultimate duration of his plan for DAPT can be discussed.  2. Hyperlipidemia - continue statin. Recent LDL was well controlled at  59.  3. PACs/PVCs/brief PSVT - continue low dose metoprolol. Palpitations are rare at this point. Will update labs to include Mg and TSH.  4. Unintentional weight loss - pt reports somewhere between 10-15lb weight loss by home scales since MI although his weight here is relatively stable (166->163lb). He does not have a PCP. We will obtain basic screening labs to include CBC, CMET, TSH and 2V CXR. Otherwise discussed calibration of home scale to ensure accuracy. PCP hotline number will be  provided.  5. Raynaud's phenomena - rare episodes noted. Discussed physiology, preventative measures and warning sx. If this becomes more frequent or bothersome, consideration could be given to adding low dose amlodipine.  Disposition: F/u with Dr. Clifton JamesMcAlhany in 6 months.   Medication Adjustments/Labs and Tests Ordered: Current medicines are reviewed at length with the patient today.  Concerns regarding medicines are outlined above. Medication changes, Labs and Tests ordered today are summarized above and listed in the Patient Instructions accessible in Encounters.   Signed, Laurann Montanaayna N Dae Antonucci, PA-C  07/13/2020 10:53 AM    [facility] [address], [place], [place]  [id number] Phone: [phone]) [phone]; Fax: [phone]) [phone]

## 2020-07-13 ENCOUNTER — Other Ambulatory Visit: Payer: Self-pay

## 2020-07-13 ENCOUNTER — Ambulatory Visit: Payer: BC Managed Care – PPO | Admitting: Physician Assistant

## 2020-07-13 ENCOUNTER — Ambulatory Visit
Admission: RE | Admit: 2020-07-13 | Discharge: 2020-07-13 | Disposition: A | Discharge status: Home / Self Care | Payer: BC Managed Care – PPO | Source: Ambulatory Visit | Attending: Physician Assistant | Admitting: Physician Assistant

## 2020-07-13 ENCOUNTER — Encounter: Payer: Self-pay | Admitting: Physician Assistant

## 2020-07-13 VITALS — BP 127/72 | HR 64 | Ht 66.0 in | Wt 163.0 lb

## 2020-07-13 DIAGNOSIS — I493 Ventricular premature depolarization: Secondary | ICD-10-CM | POA: Diagnosis not present

## 2020-07-13 DIAGNOSIS — R634 Abnormal weight loss: Secondary | ICD-10-CM

## 2020-07-13 DIAGNOSIS — I491 Atrial premature depolarization: Secondary | ICD-10-CM

## 2020-07-13 DIAGNOSIS — I471 Supraventricular tachycardia: Secondary | ICD-10-CM

## 2020-07-13 DIAGNOSIS — I7300 Raynaud's syndrome without gangrene: Secondary | ICD-10-CM

## 2020-07-13 DIAGNOSIS — I2510 Atherosclerotic heart disease of native coronary artery without angina pectoris: Secondary | ICD-10-CM | POA: Diagnosis not present

## 2020-07-13 DIAGNOSIS — E785 Hyperlipidemia, unspecified: Secondary | ICD-10-CM | POA: Diagnosis not present

## 2020-07-13 LAB — COMPREHENSIVE METABOLIC PANEL
ALT: 46 IU/L — ABNORMAL HIGH (ref 0–44)
AST: 39 IU/L (ref 0–40)
Albumin/Globulin Ratio: 1.9 (ref 1.2–2.2)
Albumin: 4.4 g/dL (ref 3.8–4.9)
Alkaline Phosphatase: 75 IU/L (ref 44–121)
BUN/Creatinine Ratio: 28 — ABNORMAL HIGH (ref 9–20)
BUN: 26 mg/dL — ABNORMAL HIGH (ref 6–24)
Bilirubin Total: 0.5 mg/dL (ref 0.0–1.2)
CO2: 22 mmol/L (ref 20–29)
Calcium: 9.7 mg/dL (ref 8.7–10.2)
Chloride: 103 mmol/L (ref 96–106)
Creatinine, Ser: 0.92 mg/dL (ref 0.76–1.27)
Globulin, Total: 2.3 g/dL (ref 1.5–4.5)
Glucose: 94 mg/dL (ref 65–99)
Potassium: 4.3 mmol/L (ref 3.5–5.2)
Sodium: 139 mmol/L (ref 134–144)
Total Protein: 6.7 g/dL (ref 6.0–8.5)
eGFR: 99 mL/min/{1.73_m2} (ref >59)

## 2020-07-13 LAB — CBC
Hematocrit: 45.4 % (ref 37.5–51.0)
Hemoglobin: 15.6 g/dL (ref 13.0–17.7)
MCH: 32.8 pg (ref 26.6–33.0)
MCHC: 34.4 g/dL (ref 31.5–35.7)
MCV: 96 fL (ref 79–97)
Platelets: 178 10*3/uL (ref 150–450)
RBC: 4.75 x10E6/uL (ref 4.14–5.80)
RDW: 11.9 % (ref 11.6–15.4)
WBC: 6.1 10*3/uL (ref 3.4–10.8)

## 2020-07-13 LAB — MAGNESIUM: Magnesium: 1.9 mg/dL (ref 1.6–2.3)

## 2020-07-13 LAB — TSH: TSH: 2.74 u[IU]/mL (ref 0.450–4.500)

## 2020-07-13 NOTE — Patient Instructions (Addendum)
Medication Instructions:  Your physician recommends that you continue on your current medications as directed. Please refer to the Current Medication list given to you today.  *If you need a refill on your cardiac medications before your next appointment, please call your pharmacy*   Lab Work: TODAY!!!!   Cmet/mag/cbc/tsh.  If you have labs (blood work) drawn today and your tests are completely normal, you will receive your results only by: Marland Kitchen MyChart Message (if you have MyChart) OR . A paper copy in the mail If you have any lab test that is abnormal or we need to change your treatment, we will call you to review the results.   Testing/Procedures: A chest x-ray takes a picture of the organs and structures inside the chest, including the heart, lungs, and blood vessels. This test can show several things, including, whether the heart is enlarges; whether fluid is building up in the lungs; and whether pacemaker / defibrillator leads are still in place.  [id number] [facility] first floor in front of building.     Follow-Up: At [facility], you and your health needs are our priority.  As part of our continuing mission to provide you with exceptional heart care, we have created designated Provider Care Teams.  These Care Teams include your primary Cardiologist (physician) and Advanced Practice Providers (APPs -  Physician Assistants and Nurse Practitioners) who all work together to provide you with the care you need, when you need it.  We recommend signing up for the patient portal called "MyChart".  Sign up information is provided on this After Visit Summary.  MyChart is used to connect with patients for Virtual Visits (Telemedicine).  Patients are able to view lab/test results, encounter notes, upcoming appointments, etc.  Non-urgent messages can be sent to your provider as well.   To learn more about what you can do with MyChart, go to [email or web address].    Your next  appointment:   6 month(s)  The format for your next appointment:   In Person  Provider:   Verne Carrow, MD   Other Instructions Your physician wants you to follow-up in: 6 month with Dr. Clifton James.  You will receive a reminder letter in the mail two months in advance. If you don't receive a letter, please call our office to schedule the follow-up appointment.  Patient was given paperwork today to  get a PCP.

## 2020-08-19 DIAGNOSIS — S76311A Strain of muscle, fascia and tendon of the posterior muscle group at thigh level, right thigh, initial encounter: Secondary | ICD-10-CM | POA: Diagnosis not present

## 2020-08-19 DIAGNOSIS — Y9373 Activity, racquet and hand sports: Secondary | ICD-10-CM | POA: Diagnosis not present

## 2020-08-19 DIAGNOSIS — X501XXA Overexertion from prolonged static or awkward postures, initial encounter: Secondary | ICD-10-CM | POA: Diagnosis not present

## 2020-08-19 DIAGNOSIS — I252 Old myocardial infarction: Secondary | ICD-10-CM | POA: Diagnosis not present

## 2020-08-19 DIAGNOSIS — M79651 Pain in right thigh: Secondary | ICD-10-CM | POA: Diagnosis not present

## 2020-08-19 DIAGNOSIS — S79921A Unspecified injury of right thigh, initial encounter: Secondary | ICD-10-CM | POA: Diagnosis not present

## 2020-08-19 DIAGNOSIS — I10 Essential (primary) hypertension: Secondary | ICD-10-CM | POA: Diagnosis not present

## 2020-08-19 DIAGNOSIS — Z955 Presence of coronary angioplasty implant and graft: Secondary | ICD-10-CM | POA: Diagnosis not present

## 2020-08-23 DIAGNOSIS — S76311A Strain of muscle, fascia and tendon of the posterior muscle group at thigh level, right thigh, initial encounter: Secondary | ICD-10-CM | POA: Diagnosis not present

## 2020-08-23 DIAGNOSIS — M25551 Pain in right hip: Secondary | ICD-10-CM | POA: Diagnosis not present

## 2020-08-23 DIAGNOSIS — M25561 Pain in right knee: Secondary | ICD-10-CM | POA: Diagnosis not present

## 2020-09-02 DIAGNOSIS — M79661 Pain in right lower leg: Secondary | ICD-10-CM | POA: Insufficient documentation

## 2020-09-08 DIAGNOSIS — M79661 Pain in right lower leg: Secondary | ICD-10-CM | POA: Diagnosis not present

## 2020-12-14 ENCOUNTER — Other Ambulatory Visit: Payer: Self-pay | Admitting: *Deleted

## 2020-12-14 MED ORDER — METOPROLOL TARTRATE 25 MG PO TABS
12.5000 mg | ORAL_TABLET | Freq: Two times a day (BID) | ORAL | 3 refills | Status: DC
Start: 2020-12-14 — End: 2021-04-14

## 2020-12-14 MED ORDER — NITROGLYCERIN 0.4 MG SL SUBL: 0.4000 mg | SUBLINGUAL_TABLET | SUBLINGUAL | 3 refills | Status: DC | PRN

## 2020-12-14 MED ORDER — ATORVASTATIN CALCIUM 80 MG PO TABS
80.0000 mg | ORAL_TABLET | Freq: Every day | ORAL | 3 refills | Status: DC
Start: 2020-12-14 — End: 2021-04-14

## 2021-03-16 ENCOUNTER — Encounter: Payer: Self-pay | Admitting: Cardiovascular Disease

## 2021-03-16 ENCOUNTER — Ambulatory Visit: Payer: BC Managed Care – PPO | Admitting: Cardiovascular Disease

## 2021-03-16 ENCOUNTER — Other Ambulatory Visit: Payer: Self-pay

## 2021-03-16 VITALS — BP 122/70 | HR 63 | Ht 66.0 in | Wt 163.2 lb

## 2021-03-16 DIAGNOSIS — I493 Ventricular premature depolarization: Secondary | ICD-10-CM | POA: Diagnosis not present

## 2021-03-16 DIAGNOSIS — I2510 Atherosclerotic heart disease of native coronary artery without angina pectoris: Secondary | ICD-10-CM

## 2021-03-16 DIAGNOSIS — E7800 Pure hypercholesterolemia, unspecified: Secondary | ICD-10-CM

## 2021-03-16 LAB — HEPATIC FUNCTION PANEL
ALT: 37 IU/L (ref 0–44)
AST: 34 IU/L (ref 0–40)
Albumin: 4.5 g/dL (ref 3.8–4.9)
Alkaline Phosphatase: 81 IU/L (ref 44–121)
Bilirubin Total: 0.5 mg/dL (ref 0.0–1.2)
Bilirubin, Direct: 0.16 mg/dL (ref 0.00–0.40)
Total Protein: 6.5 g/dL (ref 6.0–8.5)

## 2021-03-16 LAB — LIPID PANEL
Chol/HDL Ratio: 2.6 ratio (ref 0.0–5.0)
Cholesterol, Total: 124 mg/dL (ref 100–199)
HDL: 47 mg/dL (ref >39)
LDL Chol Calc (NIH): 58 mg/dL (ref 0–99)
Triglycerides: 101 mg/dL (ref 0–149)
VLDL Cholesterol Cal: 19 mg/dL (ref 5–40)

## 2021-03-16 NOTE — Patient Instructions (Signed)
Medication Instructions:  Your physician has recommended you make the following change in your medication:  1.) STOP Plavix  *If you need a refill on your cardiac medications before your next appointment, please call your pharmacy*   Lab Work: Today: lipids/lfts today  If you have labs (blood work) drawn today and your tests are completely normal, you will receive your results only by: MyChart Message (if you have MyChart) OR A paper copy in the mail If you have any lab test that is abnormal or we need to change your treatment, we will call you to review the results.   Testing/Procedures: none   Follow-Up: At [facility], you and your health needs are our priority.  As part of our continuing mission to provide you with exceptional heart care, we have created designated Provider Care Teams.  These Care Teams include your primary Cardiologist (physician) and Advanced Practice Providers (APPs -  Physician Assistants and Nurse Practitioners) who all work together to provide you with the care you need, when you need it.   Your next appointment:   12 month(s)  The format for your next appointment:   In Person  Provider:   Verne Carrow, MD     Other Instructions

## 2021-03-16 NOTE — Progress Notes (Signed)
Chief Complaint  Patient presents with   Follow-up    CAD   History of Present Illness: 56 yo male with history of CAD here today for cardiac follow up. He was admitted to [facility] 12/25/19 with an acute inferior STEMI. Cardiac cath with thrombotic occlusion of the mid RCA, treated with a drug eluting stent. Also with severe mid LAD disease treated in a staged procedure on 11/2919 with a drug eluting stent. Echo 12/26/19 with LVEF=55-60%. No valve disease. Discharged on ASA, Brilinta, statin and beta blocker. Cardiac monitor November 2021 with PACs, PVCs and 4 beat run of SVT. He did not tolerate Brilinta secondary to dyspnea and chest pain. Exercise stress test in 2022 without ischemia.   He is here today for follow up. The patient denies any chest pain, dyspnea, palpitations, lower extremity edema, orthopnea, PND, dizziness, near syncope or syncope.     Primary Care Physician: Patient, No Pcp Per (Inactive)  Past Medical History:  Diagnosis Date   Back pain    CAD (coronary artery disease)    a. inferior STEMI 11/2019 s/p DES to RCA with staged PCI/DES to LAD, normal LVEF.   Premature atrial contractions    PVC's (premature ventricular contractions)    SVT (supraventricular tachycardia) (HCC)     Past Surgical History:  Procedure Laterality Date   BACK SURGERY     CORONARY STENT INTERVENTION N/A 12/26/2019   Procedure: CORONARY STENT INTERVENTION;  Surgeon: Kathleene Hazel, MD;  Location: [facility];  Service: Cardiovascular;  Laterality: N/A;   CORONARY/GRAFT ACUTE MI REVASCULARIZATION N/A 12/25/2019   Procedure: CORONARY/GRAFT ACUTE MI REVASCULARIZATION;  Surgeon: Kathleene Hazel, MD;  Location: [facility];  Service: Cardiovascular;  Laterality: N/A;    Current Outpatient Medications  Medication Sig Dispense Refill   aspirin EC 81 MG tablet Take 81 mg by mouth daily. Swallow whole.     atorvastatin (LIPITOR) 80 MG tablet Take 1 tablet (80  mg total) by mouth daily at 6 PM. 30 tablet 3   metoprolol tartrate (LOPRESSOR) 25 MG tablet Take 0.5 tablets (12.5 mg total) by mouth 2 (two) times daily. 33 tablet 3   Multiple Vitamins-Minerals (MULTIVITAMIN ADULTS 50+ PO) Take 1 tablet by mouth daily.     nitroGLYCERIN (NITROSTAT) 0.4 MG SL tablet Place 1 tablet (0.4 mg total) under the tongue every 5 (five) minutes as needed for chest pain. 25 tablet 3   No current facility-administered medications for this visit.    No Known Allergies  Social History   Socioeconomic History   Marital status: Married    Spouse name: Not on file   Number of children: Not on file   Years of education: Not on file   Highest education level: Not on file  Occupational History   Not on file  Tobacco Use   Smoking status: Former    Packs/day: 2.00    Years: 30.00    Pack years: 60.00    Types: Cigarettes   Smokeless tobacco: Former  Substance and Sexual Activity   Alcohol use: Not on file   Drug use: Not on file   Sexual activity: Not on file  Other Topics Concern   Not on file  Social History Narrative   Not on file   Social Determinants of Health   Financial Resource Strain: Not on file  Food Insecurity: Not on file  Transportation Needs: Not on file  Physical Activity: Not on file  Stress: Not on file  Social Connections: Not on file  Intimate Partner Violence: Not on file    Family History  Problem Relation Age of Onset   CAD Other     Review of Systems:  As stated in the HPI and otherwise negative.   BP 122/70    Pulse 63    Ht 5\' 6"  (1.676 m)    Wt 163 lb 3.2 oz (74 kg)    SpO2 98%    BMI 26.34 kg/m   Physical Examination:  General: Well developed, well nourished, NAD  HEENT: OP clear, mucus membranes moist  SKIN: warm, dry. No rashes. Neuro: No focal deficits  Musculoskeletal: Muscle strength 5/5 all ext  Psychiatric: Mood and affect normal  Neck: No JVD, no carotid bruits, no thyromegaly, no lymphadenopathy.   Lungs:Clear bilaterally, no wheezes, rhonci, crackles Cardiovascular: Regular rate and rhythm. No murmurs, gallops or rubs. Abdomen:Soft. Bowel sounds present. Non-tender.  Extremities: No lower extremity edema. Pulses are 2 + in the bilateral DP/PT.  EKG:  EKG is not ordered today. The ekg ordered today demonstrates   Echo 12/26/19:  1. Left ventricular ejection fraction, by estimation, is 55 to 60%. The  left ventricle has normal function. The left ventricle has no regional  wall motion abnormalities. Left ventricular diastolic parameters were  normal.   2. Right ventricular systolic function is normal. The right ventricular  size is normal. Tricuspid regurgitation signal is inadequate for assessing  PA pressure.   3. The mitral valve is grossly normal. Trivial mitral valve  regurgitation. No evidence of mitral stenosis.   4. The aortic valve is tricuspid. Aortic valve regurgitation is not  visualized. No aortic stenosis is present.   5. The inferior vena cava is normal in size with greater than 50%  respiratory variability, suggesting right atrial pressure of 3 mmHg.   Conclusion(s)/Recommendation(s): Normal biventricular function without  evidence of hemodynamically significant valvular heart disease.   FINDINGS   Left Ventricle: Left ventricular ejection fraction, by estimation, is 55  to 60%. The left ventricle has normal function. The left ventricle has no  regional wall motion abnormalities. The left ventricular internal cavity  size was normal in size. There is   no left ventricular hypertrophy. Left ventricular diastolic parameters  were normal.   Recent Labs: 07/13/2020: ALT 46; BUN 26; Creatinine, Ser 0.92; Hemoglobin 15.6; Magnesium 1.9; Platelets 178; Potassium 4.3; Sodium 139; TSH 2.740   Lipid Panel    Component Value Date/Time   CHOL 119 03/08/2020 0849   TRIG 72 03/08/2020 0849   HDL 45 03/08/2020 0849   CHOLHDL 2.6 03/08/2020 0849   CHOLHDL 5.4  12/25/2019 1450   VLDL 33 12/25/2019 1450   LDLCALC 59 03/08/2020 0849     Wt Readings from Last 3 Encounters:  03/16/21 163 lb 3.2 oz (74 kg)  07/13/20 163 lb (73.9 kg)  04/12/20 164 lb 6.4 oz (74.6 kg)    Assessment and Plan:   1. CAD with angina: No chest pain. Continue ASA, statin and beta blocker. Will stop Plavix.   2. Hyperlipidemia: LDL at goal in January 2022. LDL Continue statin. Repeat lipids and LFTs now.   3. History of PACs/PVCs: No recent palpitations. Continue beta blocker  Current medicines are reviewed at length with the patient today.  The patient does not have concerns regarding medicines.  The following changes have been made:  no change  Labs/ tests ordered today include:   Orders Placed This Encounter  Procedures   Lipid panel  Hepatic function panel   Disposition:   F/U with me in 12 months.    Signed, Verne Carrow, MD 03/16/2021 9:38 AM    [facility] [address] [place], [place], [place]  [address] Phone: [phone]; Fax: [phone]

## 2021-04-14 ENCOUNTER — Other Ambulatory Visit: Payer: Self-pay | Admitting: *Deleted

## 2021-04-14 MED ORDER — METOPROLOL TARTRATE 25 MG PO TABS: 12.5000 mg | ORAL_TABLET | Freq: Two times a day (BID) | ORAL | 3 refills | Status: DC

## 2021-04-14 MED ORDER — ATORVASTATIN CALCIUM 80 MG PO TABS: 80.0000 mg | ORAL_TABLET | Freq: Every day | ORAL | 3 refills | Status: DC

## 2021-04-20 ENCOUNTER — Ambulatory Visit: Payer: BC Managed Care – PPO | Admitting: Cardiovascular Disease

## 2021-06-13 DIAGNOSIS — N419 Inflammatory disease of prostate, unspecified: Secondary | ICD-10-CM | POA: Insufficient documentation

## 2021-06-30 DIAGNOSIS — R399 Unspecified symptoms and signs involving the genitourinary system: Secondary | ICD-10-CM | POA: Insufficient documentation

## 2021-06-30 DIAGNOSIS — R350 Frequency of micturition: Secondary | ICD-10-CM | POA: Insufficient documentation

## 2021-08-03 IMAGING — DX DG CHEST 2V
2 series · 2 of 2 positions shown · non-contrast
Comparison: Chest x-ray 12/25/2019.

CLINICAL DATA: 54-year-old male with history of unintentional
weight loss.

EXAM:
CHEST - 2 VIEW

[dg chest 2 view (1 of 2)]
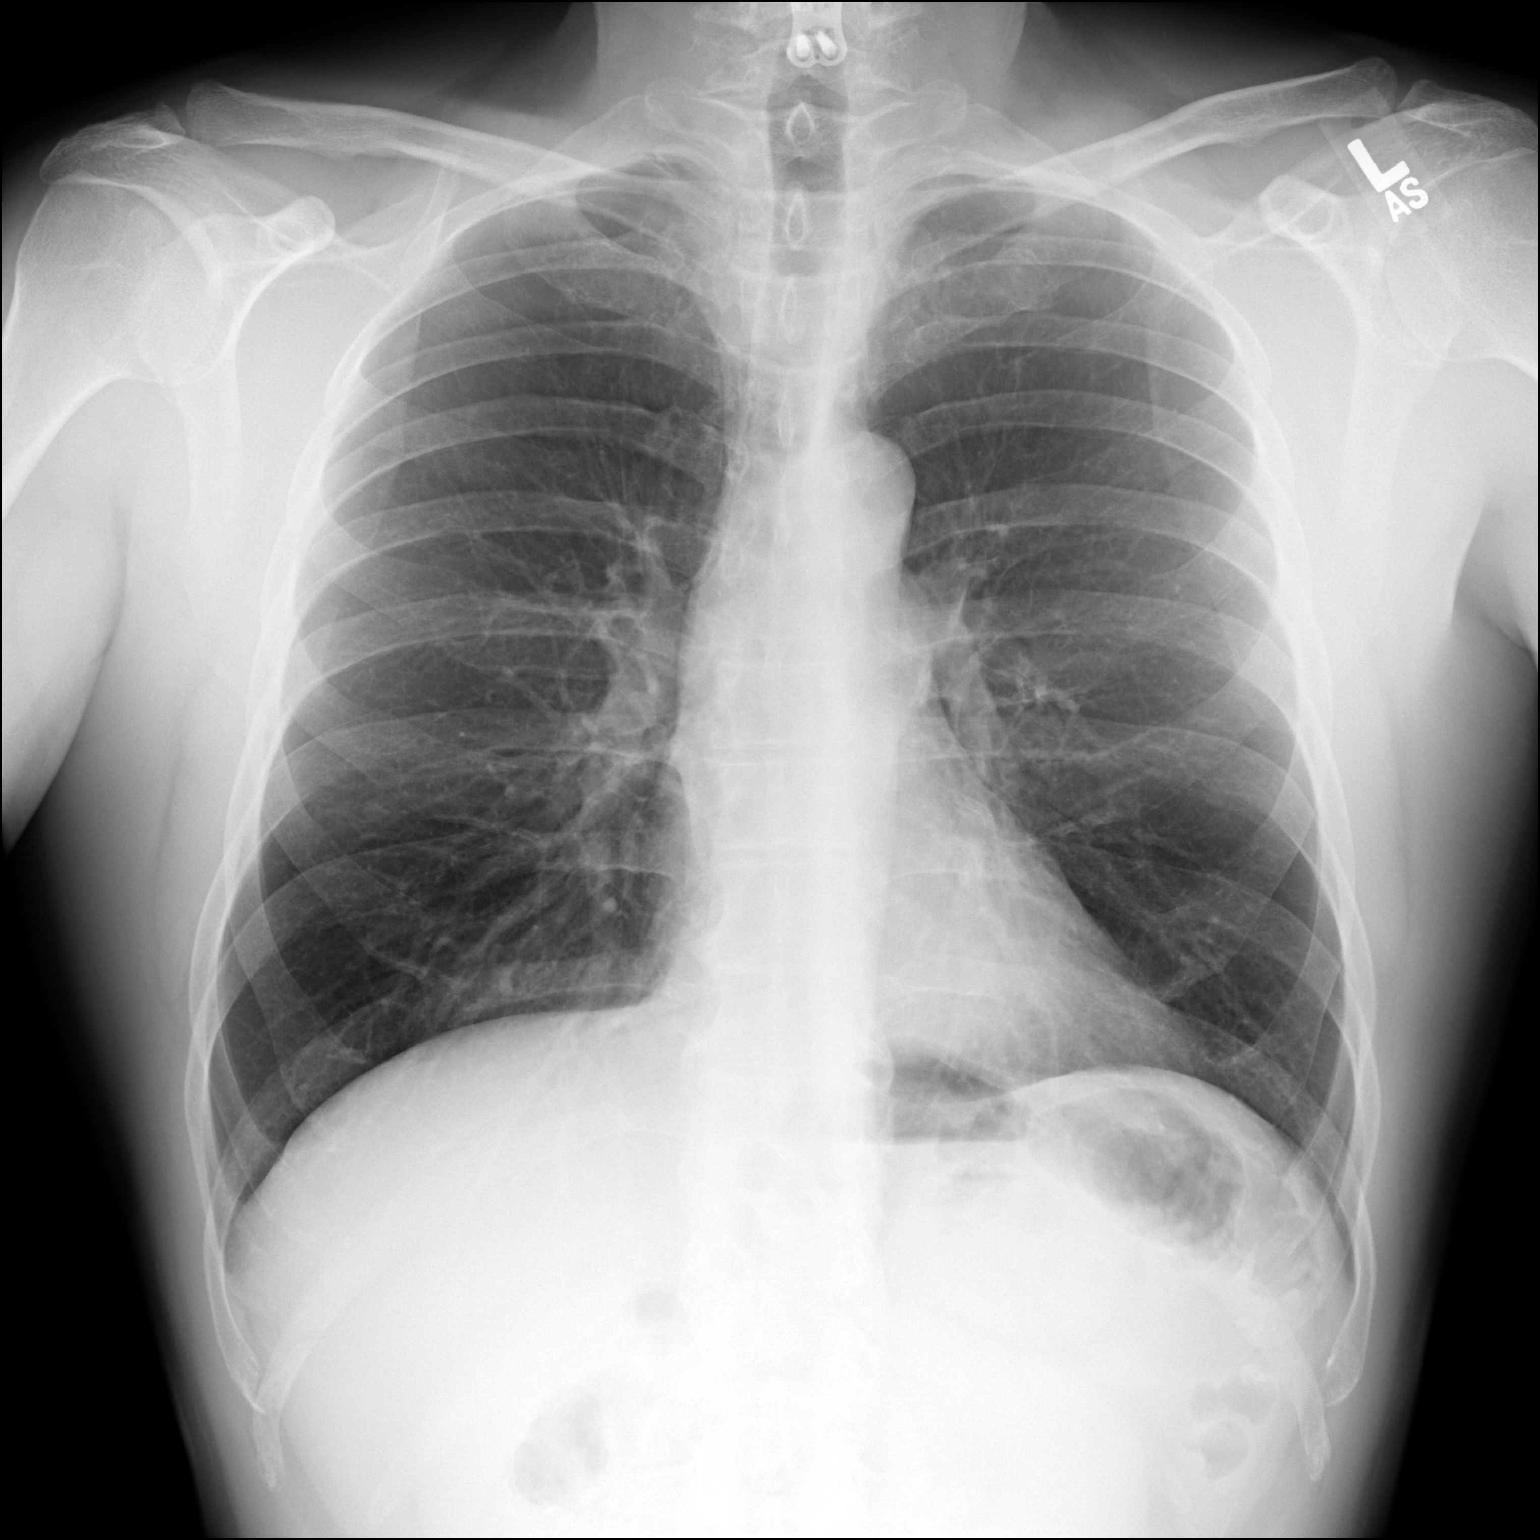

[dg chest 2 view (2 of 2)]
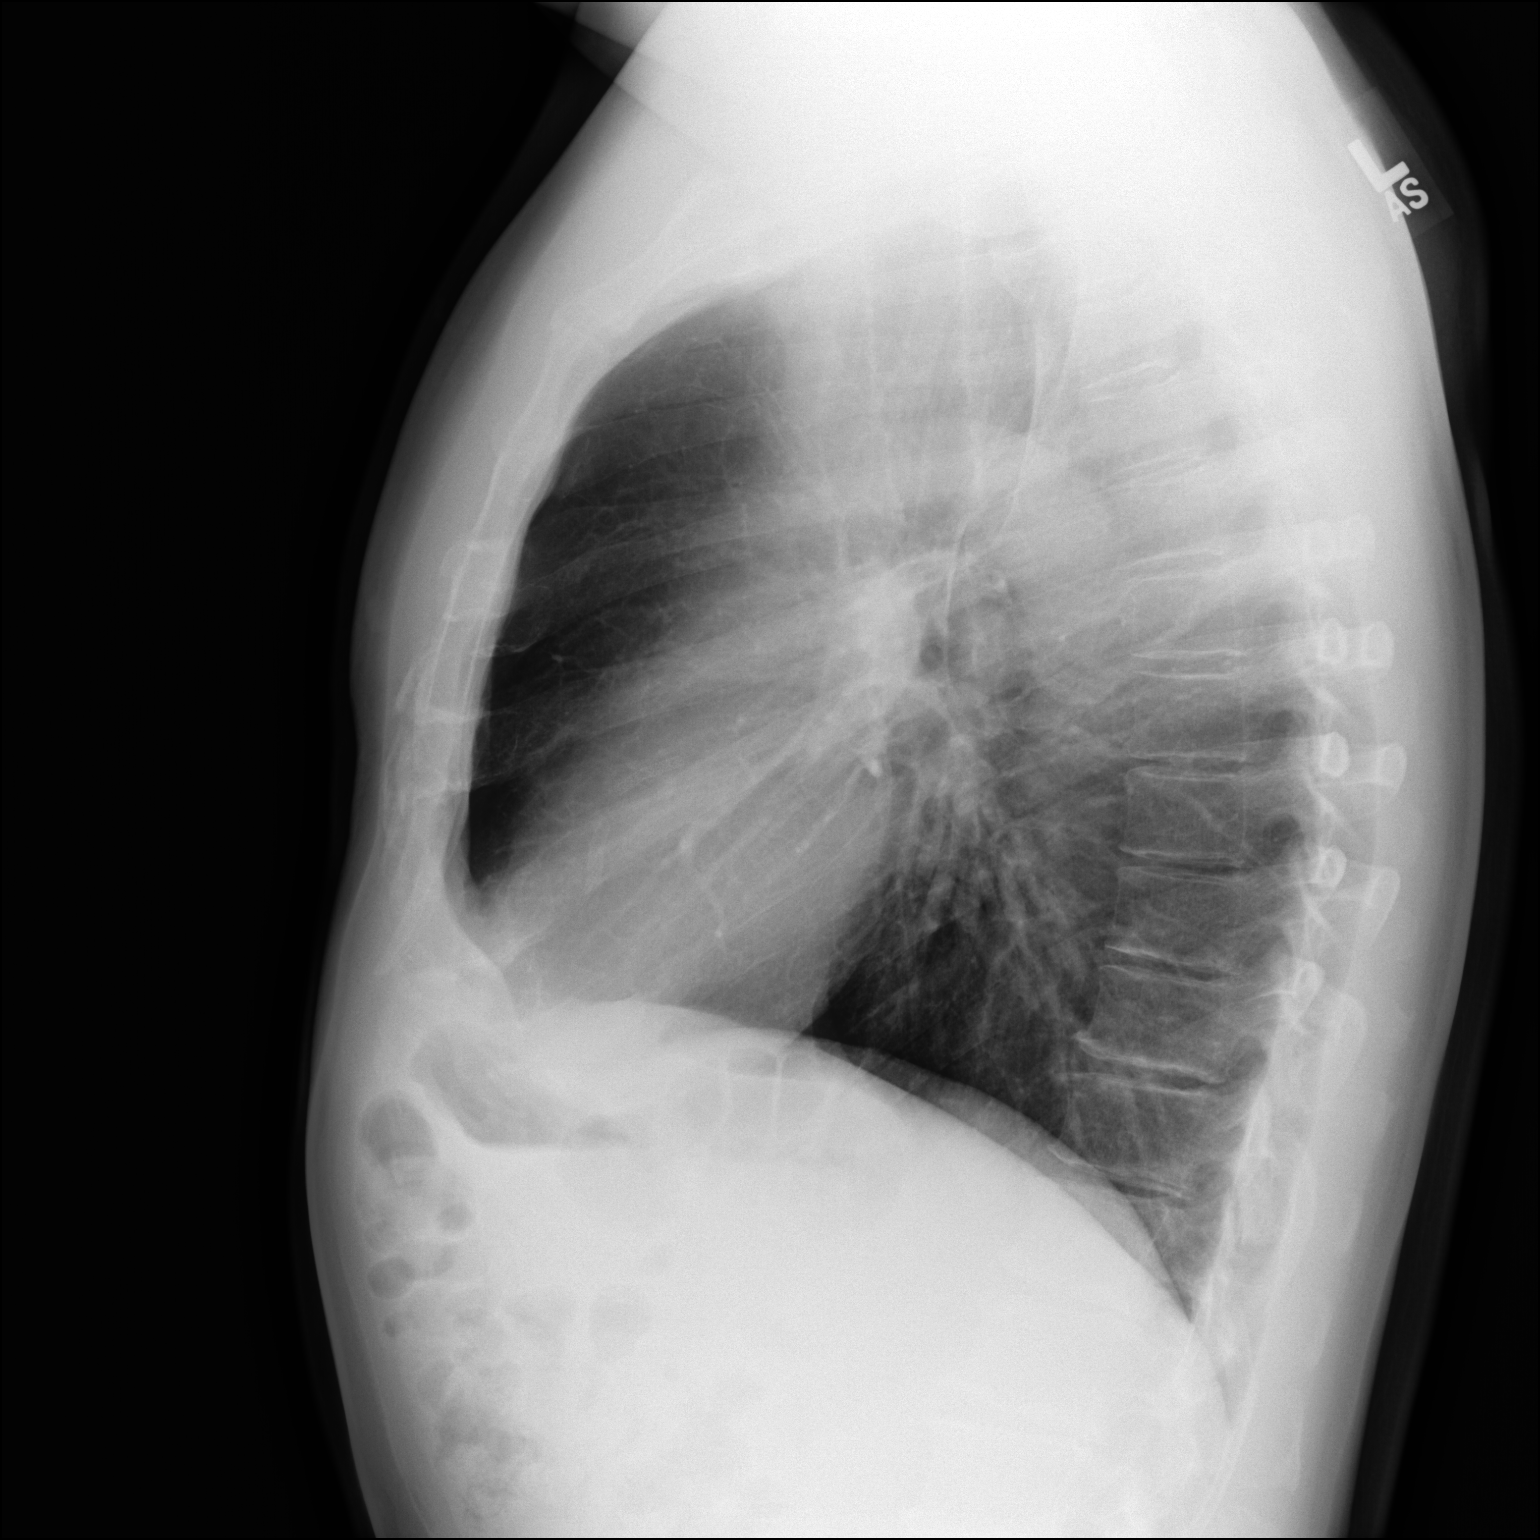

[2 of 2 positions shown; findings below may reference images not displayed]

FINDINGS: Lung volumes are normal. No consolidative airspace disease. No
pleural effusions. No pneumothorax. No pulmonary nodule or mass
noted. Pulmonary vasculature and the cardiomediastinal silhouette
are within normal limits. Orthopedic fixation hardware in the lower
cervical spine incompletely imaged
IMPRESSION: No radiographic evidence of acute cardiopulmonary disease.

## 2021-11-05 ENCOUNTER — Other Ambulatory Visit: Payer: Self-pay | Admitting: Cardiovascular Disease

## 2022-01-25 ENCOUNTER — Other Ambulatory Visit: Payer: Self-pay | Admitting: Cardiovascular Disease

## 2022-03-20 ENCOUNTER — Other Ambulatory Visit: Payer: Self-pay | Admitting: Cardiovascular Disease

## 2022-03-28 NOTE — Progress Notes (Signed)
Cardiology Office Note:    Date:  03/29/2022   ID:  Marvin Velasquez, DOB Jul 12, 1965, MRN [id number]  PCP:  Patient, No Pcp Per   [facility] Cardiologist:  Lauree Chandler, MD     Referring MD: No ref. provider found   Chief Complaint  Patient presents with   Follow-up    Seen for Dr. Angelena Form    History of Present Illness:    Marvin Velasquez is a 57 y.o. male with a hx of CAD (acute inferior STEMI, DES to mid RCA 12/15/2019, staged procedure on 12/26/2019 DES to LAD), PVCs, SVT, PACs.  Last seen in our office on 03/16/2021 by Dr. Angelena Form, at that time he was doing well from a cardiac perspective and his Plavix was discontinued.  He presents today for follow-up of his CAD.  He continues to be active at work, and typically walks for exercise on his lunch break.  He endorses chest pain that typically occurs after he has been sitting for some time.  He could be sitting in his recliner, or riding in a car for a period of time, after he stands up he notices chest pain that is dull in nature, substernal across his chest that radiates down both of his arms.  When the pain occurs, he typically stops walking and the pain subsides within 1 to 2 minutes.  He has not taken his nitroglycerin when this occurs, he says it resolves quicker than he thinks to take it.  He does not have any other associated cardiac symptoms (diaphoresis, nausea, vomiting, reflux, shortness of breath, palpitations, dizziness).  He advises this sensation is similar to when he previously had his MI, with the exception that the pain with his MI was unrelenting. He previously mentioned these symptoms to Dr. Angelena Form in 2022, and an ETT was ordered at that time, which was overall reassuring. He states these episodes have been occurring for some time ~ years, but he feels they have increased in frequency over the last few months, and he feels that "this can't be normal". He denies palpitations, dyspnea, pnd,  orthopnea, n, v, dizziness, syncope, edema, weight gain, or early satiety.     Past Medical History:  Diagnosis Date   Back pain    CAD (coronary artery disease)    a. inferior STEMI 11/2019 s/p DES to RCA with staged PCI/DES to LAD, normal LVEF.   Premature atrial contractions    PVC's (premature ventricular contractions)    SVT (supraventricular tachycardia)     Past Surgical History:  Procedure Laterality Date   BACK SURGERY     CORONARY STENT INTERVENTION N/A 12/26/2019   Procedure: CORONARY STENT INTERVENTION;  Surgeon: Burnell Blanks, MD;  Location: [place] [facility];  Service: Cardiovascular;  Laterality: N/A;   CORONARY/GRAFT ACUTE MI REVASCULARIZATION N/A 12/25/2019   Procedure: CORONARY/GRAFT ACUTE MI REVASCULARIZATION;  Surgeon: Burnell Blanks, MD;  Location: [place] [facility];  Service: Cardiovascular;  Laterality: N/A;    Current Medications: Current Meds  Medication Sig   aspirin EC 81 MG tablet Take 81 mg by mouth daily. Swallow whole.   Multiple Vitamins-Minerals (MULTIVITAMIN ADULTS 50+ PO) Take 1 tablet by mouth daily.   [DISCONTINUED] atorvastatin (LIPITOR) 80 MG tablet Take 1 tablet (80 mg total) by mouth daily at 6 PM.   [DISCONTINUED] metoprolol tartrate (LOPRESSOR) 25 MG tablet Take 1 tablet (25 mg total) by mouth 2 (two) times daily.   [DISCONTINUED] nitroGLYCERIN (NITROSTAT) 0.4 MG SL tablet  PLACE 1 TABLET UNDER THE TONGUE EVERY 5 MINUTES AS NEEDED FOR CHEST PAIN     Allergies:   Patient has no known allergies.   Social History   Socioeconomic History   Marital status: Married    Spouse name: Not on file   Number of children: Not on file   Years of education: Not on file   Highest education level: Not on file  Occupational History   Not on file  Tobacco Use   Smoking status: Former    Packs/day: 2.00    Years: 30.00    Total pack years: 60.00    Types: Cigarettes   Smokeless tobacco: Former  Substance and Sexual Activity    Alcohol use: Not on file   Drug use: Not on file   Sexual activity: Not on file  Other Topics Concern   Not on file  Social History Narrative   Not on file   Social Determinants of Health   Financial Resource Strain: Not on file  Food Insecurity: Not on file  Transportation Needs: Not on file  Physical Activity: Not on file  Stress: Not on file  Social Connections: Not on file     Family History: The patient's family history includes CAD in an other family member.  ROS:   Please see the history of present illness.    All other systems reviewed and are negative.  EKGs/Labs/Other Studies Reviewed:    The following studies were reviewed today:  ETT 06/14/20 Blood pressure demonstrated a normal response to exercise. There was no ST segment deviation noted during stress.   ETT with good exercise tolerance (12:30); patient complained of mild chest pain towards the end of exercise relieved in recovery; normal blood pressure response; normal heart rate response; no ST changes; negative inadequate exercise treadmill (patient achieved 80% target heart rate).   EMERGENT CATH/PCI 12/25/19 Mid RCA lesion is 100% stenosed. Prox Cx to Mid Cx lesion is 20% stenosed. Mid LAD lesion is 80% stenosed. A drug-eluting stent was successfully placed using a SYNERGY XD 3.0X16. Post intervention, there is a 0% residual stenosis.   1. Acute inferior STEMI secondary to thrombotic occlusion of the mid RCA 2. Successful PTCA/DES x 1 mid RCA 3. Severe stenosis mid LAD  PCI 12/26/2019 Mid LAD lesion is 80% stenosed. Prox Cx to Mid Cx lesion is 20% stenosed. Previously placed Mid RCA drug eluting stent is widely patent. Balloon angioplasty was performed. A drug-eluting stent was successfully placed using a SYNERGY XD 2.50X20. Post intervention, there is a 0% residual stenosis.   1. Patent RCA with patent mid stent 2. Severe stenosis mid LAD 3. Successful PTCA/DES x 1 mid LAD    EKG:   EKG is ordered today.  The ekg ordered today demonstrates sinus rhythm, 66 bpm, consistent with previous EKG tracings.  Recent Labs: No results found for requested labs within last 365 days.  Recent Lipid Panel    Component Value Date/Time   CHOL 124 03/16/2021 0925   TRIG 101 03/16/2021 0925   HDL 47 03/16/2021 0925   CHOLHDL 2.6 03/16/2021 0925   CHOLHDL 5.4 12/25/2019 1450   VLDL 33 12/25/2019 1450   LDLCALC 58 03/16/2021 0925     Risk Assessment/Calculations:                Physical Exam:    VS:  BP 124/80   Pulse 66   Ht 5\' 6"  (1.676 m)   Wt 170 lb 3.2 oz (77.2 kg)  SpO2 99%   BMI 27.47 kg/m     Wt Readings from Last 3 Encounters:  03/29/22 170 lb 3.2 oz (77.2 kg)  03/16/21 163 lb 3.2 oz (74 kg)  07/13/20 163 lb (73.9 kg)     GEN:  Well nourished, well developed in no acute distress HEENT: Normal NECK: No JVD; No carotid bruits LYMPHATICS: No lymphadenopathy CARDIAC: RRR, no murmurs, rubs, gallops RESPIRATORY:  Clear to auscultation without rales, wheezing or rhonchi  ABDOMEN: Soft, non-tender, non-distended MUSCULOSKELETAL:  No edema; No deformity  SKIN: Warm and dry NEUROLOGIC:  Alert and oriented x 3 PSYCHIATRIC:  Normal affect   ASSESSMENT:    1. Atypical chest pain   2. Coronary artery disease involving native coronary artery of native heart without angina pectoris   3. Essential hypertension   4. Mixed hyperlipidemia   5. Palpitations   6. PSVT (paroxysmal supraventricular tachycardia)    PLAN:    In order of problems listed above:  Atypical chest pain/CAD -endorses chest pain, typically after periods of being stationary, mixed features, does not occur with exertion, typically notes after sitting for some time. Chest pain is consistent with previous MI pain.   He has not taken his nitroglycerin when these symptoms occur.  Continue aspirin, Lopressor, lipid control.  Will arrange nuclear stress test. We discussed adding Imdur (15 mg daily)  to his current medication regimen, however he would prefer to hold off at this time and see what the nuclear stress test shows.  Advised him to take his nitroglycerin during these episodes.  Hypertension -BP today 124/80, currently well-controlled.  Continue Lopressor.  HLD - LDL 58 on 03/16/21, he is not fasting today. Will check FLP and LFTs.   Palpitations/PSVT - Quiescent. Continue Lopressor.  Disposition - Nuclear stress test. FLP, and LFTs for next week. Repeat in 6 weeks.       Shared Decision Making/Informed Consent The risks [chest pain, shortness of breath, cardiac arrhythmias, dizziness, blood pressure fluctuations, myocardial infarction, stroke/transient ischemic attack, nausea, vomiting, allergic reaction, radiation exposure, metallic taste sensation and life-threatening complications (estimated to be 1 in 10,000)], benefits (risk stratification, diagnosing coronary artery disease, treatment guidance) and alternatives of a nuclear stress test were discussed in detail with Mr. Federici and he agrees to proceed.    Medication Adjustments/Labs and Tests Ordered: Current medicines are reviewed at length with the patient today.  Concerns regarding medicines are outlined above.  Orders Placed This Encounter  Procedures   Comp Met (CMET)   Lipid panel   Cardiac Stress Test: Informed Consent Details: Physician/Practitioner Attestation; Transcribe to consent form and obtain patient signature   MYOCARDIAL PERFUSION IMAGING   EKG 12-Lead   Meds ordered this encounter  Medications   atorvastatin (LIPITOR) 80 MG tablet    Sig: Take 1 tablet (80 mg total) by mouth daily at 6 PM.    Dispense:  90 tablet    Refill:  3   metoprolol tartrate (LOPRESSOR) 25 MG tablet    Sig: Take 1 tablet (25 mg total) by mouth 2 (two) times daily.    Dispense:  180 tablet    Refill:  3   nitroGLYCERIN (NITROSTAT) 0.4 MG SL tablet    Sig: PLACE 1 TABLET UNDER THE TONGUE EVERY 5 MINUTES AS NEEDED FOR CHEST  PAIN    Dispense:  25 tablet    Refill:  3    Patient Instructions  Medication Instructions:  Your physician recommends that you continue on your current medications as  directed. Please refer to the Current Medication list given to you today.  *If you need a refill on your cardiac medications before your next appointment, please call your pharmacy*   Lab Work: AT TIME OF STRESS TEST, COME FASTING FOR:  CMET & LIPID  If you have labs (blood work) drawn today and your tests are completely normal, you will receive your results only by: MyChart Message (if you have MyChart) OR A paper copy in the mail If you have any lab test that is abnormal or we need to change your treatment, we will call you to review the results.   Testing/Procedures: Your physician has requested that you have a lexiscan myoview. For further information please visit [email or web address] Please follow instruction sheet below:    You are scheduled for a Myocardial Perfusion Imaging Study  Please arrive 15 minutes prior to your appointment time for registration and insurance purposes.  The test will take approximately 3 to 4 hours to complete; you may bring reading material.  If someone comes with you to your appointment, they will need to remain in the main lobby due to limited space in the testing area. **If you are pregnant or breastfeeding, please notify the nuclear lab prior to your appointment**  How to prepare for your Myocardial Perfusion Test: Do not eat or drink 3 hours prior to your test, except you may have water. Do not consume products containing caffeine (regular or decaffeinated) 12 hours prior to your test. (ex: coffee, chocolate, sodas, tea). Do bring a list of your current medications with you.  If not listed below, you may take your medications as normal. Do wear comfortable clothes (no dresses or overalls) and walking shoes, tennis shoes preferred (No heels or open toe shoes are allowed). Do NOT  wear cologne, perfume, aftershave, or lotions (deodorant is allowed). If these instructions are not followed, your test will have to be rescheduled.     Follow-Up: At [facility], you and your health needs are our priority.  As part of our continuing mission to provide you with exceptional heart care, we have created designated Provider Care Teams.  These Care Teams include your primary Cardiologist (physician) and Advanced Practice Providers (APPs -  Physician Assistants and Nurse Practitioners) who all work together to provide you with the care you need, when you need it.  We recommend signing up for the patient portal called "MyChart".  Sign up information is provided on this After Visit Summary.  MyChart is used to connect with patients for Virtual Visits (Telemedicine).  Patients are able to view lab/test results, encounter notes, upcoming appointments, etc.  Non-urgent messages can be sent to your provider as well.   To learn more about what you can do with MyChart, go to [email or web address].    Your next appointment:   4 week(s)  Provider:   Verne Carrow, MD  or Tereso Newcomer, PA-C         Other Instructions    Signed, Flossie Dibble, NP  03/29/2022 9:00 AM    [facility]

## 2022-03-29 ENCOUNTER — Encounter: Payer: Self-pay | Admitting: Cardiology

## 2022-03-29 ENCOUNTER — Ambulatory Visit: Payer: BC Managed Care – PPO | Attending: Physician Assistant | Admitting: Cardiology

## 2022-03-29 VITALS — BP 124/80 | HR 66 | Ht 66.0 in | Wt 170.2 lb

## 2022-03-29 DIAGNOSIS — I2510 Atherosclerotic heart disease of native coronary artery without angina pectoris: Secondary | ICD-10-CM

## 2022-03-29 DIAGNOSIS — E782 Mixed hyperlipidemia: Secondary | ICD-10-CM | POA: Diagnosis not present

## 2022-03-29 DIAGNOSIS — R0789 Other chest pain: Secondary | ICD-10-CM

## 2022-03-29 DIAGNOSIS — I10 Essential (primary) hypertension: Secondary | ICD-10-CM

## 2022-03-29 DIAGNOSIS — I471 Supraventricular tachycardia, unspecified: Secondary | ICD-10-CM

## 2022-03-29 DIAGNOSIS — R002 Palpitations: Secondary | ICD-10-CM

## 2022-03-29 MED ORDER — METOPROLOL TARTRATE 25 MG PO TABS: 25.0000 mg | ORAL_TABLET | Freq: Two times a day (BID) | ORAL | 3 refills | Status: DC

## 2022-03-29 MED ORDER — ATORVASTATIN CALCIUM 80 MG PO TABS: 80.0000 mg | ORAL_TABLET | Freq: Every day | ORAL | 3 refills | Status: DC

## 2022-03-29 MED ORDER — NITROGLYCERIN 0.4 MG SL SUBL: SUBLINGUAL_TABLET | SUBLINGUAL | 3 refills | Status: DC

## 2022-03-29 NOTE — Patient Instructions (Signed)
Medication Instructions:  Your physician recommends that you continue on your current medications as directed. Please refer to the Current Medication list given to you today.  *If you need a refill on your cardiac medications before your next appointment, please call your pharmacy*   Lab Work: AT TIME OF STRESS TEST, COME FASTING FOR:  CMET & LIPID  If you have labs (blood work) drawn today and your tests are completely normal, you will receive your results only by: [place] (if you have MyChart) OR A paper copy in the mail If you have any lab test that is abnormal or we need to change your treatment, we will call you to review the results.   Testing/Procedures: Your physician has requested that you have a lexiscan myoview. For further information please visit [email or web address]. Please follow instruction sheet below:    You are scheduled for a Myocardial Perfusion Imaging Study  Please arrive 15 minutes prior to your appointment time for registration and insurance purposes.  The test will take approximately 3 to 4 hours to complete; you may bring reading material.  If someone comes with you to your appointment, they will need to remain in the main lobby due to limited space in the testing area. **If you are pregnant or breastfeeding, please notify the nuclear lab prior to your appointment**  How to prepare for your Myocardial Perfusion Test: Do not eat or drink 3 hours prior to your test, except you may have water. Do not consume products containing caffeine (regular or decaffeinated) 12 hours prior to your test. (ex: coffee, chocolate, sodas, tea). Do bring a list of your current medications with you.  If not listed below, you may take your medications as normal. Do wear comfortable clothes (no dresses or overalls) and walking shoes, tennis shoes preferred (No heels or open toe shoes are allowed). Do NOT wear cologne, perfume, aftershave, or lotions (deodorant is allowed). If  these instructions are not followed, your test will have to be rescheduled.     Follow-Up: At [facility], you and your health needs are our priority.  As part of our continuing mission to provide you with exceptional heart care, we have created designated Provider Care Teams.  These Care Teams include your primary Cardiologist (physician) and Advanced Practice Providers (APPs -  Physician Assistants and Nurse Practitioners) who all work together to provide you with the care you need, when you need it.  We recommend signing up for the patient portal called "MyChart".  Sign up information is provided on this After Visit Summary.  MyChart is used to connect with patients for Virtual Visits (Telemedicine).  Patients are able to view lab/test results, encounter notes, upcoming appointments, etc.  Non-urgent messages can be sent to your provider as well.   To learn more about what you can do with MyChart, go to [email or web address].    Your next appointment:   4 week(s)  Provider:   Lauree Chandler, MD  or Richardson Dopp, PA-C         Other Instructions

## 2022-04-05 ENCOUNTER — Telehealth (HOSPITAL_COMMUNITY): Payer: Self-pay | Admitting: *Deleted

## 2022-04-05 NOTE — Telephone Encounter (Signed)
Pt reached and given instructions for MPI study on 04/07/22.

## 2022-04-07 ENCOUNTER — Ambulatory Visit: Payer: BC Managed Care – PPO

## 2022-04-07 ENCOUNTER — Ambulatory Visit (HOSPITAL_COMMUNITY): Payer: BC Managed Care – PPO | Attending: Physician Assistant

## 2022-04-07 DIAGNOSIS — I10 Essential (primary) hypertension: Secondary | ICD-10-CM | POA: Diagnosis not present

## 2022-04-07 DIAGNOSIS — I2510 Atherosclerotic heart disease of native coronary artery without angina pectoris: Secondary | ICD-10-CM

## 2022-04-07 DIAGNOSIS — E782 Mixed hyperlipidemia: Secondary | ICD-10-CM

## 2022-04-07 DIAGNOSIS — R002 Palpitations: Secondary | ICD-10-CM

## 2022-04-07 DIAGNOSIS — I471 Supraventricular tachycardia, unspecified: Secondary | ICD-10-CM

## 2022-04-07 DIAGNOSIS — R0789 Other chest pain: Secondary | ICD-10-CM | POA: Diagnosis not present

## 2022-04-07 LAB — MYOCARDIAL PERFUSION IMAGING
LV dias vol: 74 mL (ref 62–150)
LV sys vol: 33 mL
Nuc Stress EF: 55 %
Peak HR: 85 {beats}/min
Rest HR: 54 {beats}/min
Rest Nuclear Isotope Dose: 10.3 mCi
SDS: 1
SRS: 0
SSS: 1
ST Depression (mm): 0 mm
Stress Nuclear Isotope Dose: 30.9 mCi
TID: 0.91

## 2022-04-07 MED ORDER — TECHNETIUM TC 99M TETROFOSMIN IV KIT
10.3000 | PACK | Freq: Once | INTRAVENOUS | Status: AC | PRN
  Administered 2022-04-07: 10.3 via INTRAVENOUS

## 2022-04-07 MED ORDER — REGADENOSON 0.4 MG/5ML IV SOLN
0.4000 mg | Freq: Once | INTRAVENOUS | Status: AC
  Administered 2022-04-07: 0.4 mg via INTRAVENOUS

## 2022-04-07 MED ORDER — TECHNETIUM TC 99M TETROFOSMIN IV KIT
30.9000 | PACK | Freq: Once | INTRAVENOUS | Status: AC | PRN
  Administered 2022-04-07: 30.9 via INTRAVENOUS

## 2022-04-08 LAB — COMPREHENSIVE METABOLIC PANEL WITH GFR
ALT: 35 IU/L (ref 0–44)
AST: 33 IU/L (ref 0–40)
Albumin/Globulin Ratio: 2.8 — ABNORMAL HIGH (ref 1.2–2.2)
Albumin: 4.7 g/dL (ref 3.8–4.9)
Alkaline Phosphatase: 72 IU/L (ref 44–121)
BUN/Creatinine Ratio: 23 — ABNORMAL HIGH (ref 9–20)
BUN: 21 mg/dL (ref 6–24)
Bilirubin Total: 0.6 mg/dL (ref 0.0–1.2)
CO2: 24 mmol/L (ref 20–29)
Calcium: 9 mg/dL (ref 8.7–10.2)
Chloride: 102 mmol/L (ref 96–106)
Creatinine, Ser: 0.93 mg/dL (ref 0.76–1.27)
Globulin, Total: 1.7 g/dL (ref 1.5–4.5)
Glucose: 99 mg/dL (ref 70–99)
Potassium: 4.3 mmol/L (ref 3.5–5.2)
Sodium: 138 mmol/L (ref 134–144)
Total Protein: 6.4 g/dL (ref 6.0–8.5)
eGFR: 96 mL/min/1.73

## 2022-04-08 LAB — LIPID PANEL
Chol/HDL Ratio: 2.6 ratio (ref 0.0–5.0)
Cholesterol, Total: 131 mg/dL (ref 100–199)
HDL: 50 mg/dL
LDL Chol Calc (NIH): 67 mg/dL (ref 0–99)
Triglycerides: 67 mg/dL (ref 0–149)
VLDL Cholesterol Cal: 14 mg/dL (ref 5–40)

## 2022-04-24 DIAGNOSIS — Z09 Encounter for follow-up examination after completed treatment for conditions other than malignant neoplasm: Secondary | ICD-10-CM | POA: Diagnosis not present

## 2022-04-24 DIAGNOSIS — Z1211 Encounter for screening for malignant neoplasm of colon: Secondary | ICD-10-CM | POA: Diagnosis not present

## 2022-04-24 DIAGNOSIS — D122 Benign neoplasm of ascending colon: Secondary | ICD-10-CM | POA: Diagnosis not present

## 2022-04-24 DIAGNOSIS — Z85038 Personal history of other malignant neoplasm of large intestine: Secondary | ICD-10-CM | POA: Diagnosis not present

## 2022-04-24 DIAGNOSIS — D120 Benign neoplasm of cecum: Secondary | ICD-10-CM | POA: Diagnosis not present

## 2022-04-24 DIAGNOSIS — K5730 Diverticulosis of large intestine without perforation or abscess without bleeding: Secondary | ICD-10-CM | POA: Diagnosis not present

## 2022-04-27 NOTE — Progress Notes (Addendum)
Cardiology Office Note:    Date:  04/27/2022  ID:  Marvin Velasquez, DOB 1965/03/19, MRN [id number] [facility] Cardiologist:  Lauree Chandler, MD      Patient Profile:   Coronary artery disease  Inferior STEMI 12/25/2019 s/p 3 x 16 mm DES to the mid RCA Staged PCI 12/26/2019: Mid LAD 80, proximal LCx 20, mid RCA stent patent; s/p 2.5 x 20 mm DES to the mid LAD TTE 12/26/2019: EF 55-60, no RWMA, normal RVSF, trivial MR, RAP 3 ETT 06/14/20: normal  Myoview 04/07/2022: Inferior and inferoseptal infarct, no ischemia, normal EF, low risk PVCs, PACs, SVT Monitor 01/27/2020: 4 beat run SVT, rare PACs, rare PVCs, NSR Hyperlipidemia     History of Present Illness:   Marvin Velasquez is a 57 y.o. male returns for follow-up on CAD.  He was last seen in clinic by Venia Carbon, NP 03/29/2022.  He noted symptoms of chest pain.  Nuclear stress test was arranged and demonstrated inferior infarct with no ischemia.  Study was overall low risk.  He is here today with his wife.  He continues to have the same symptoms.  He actually has had chest discomfort predating his myocardial infarction.  The symptoms never really went away.  He has associated bilateral arm pain.  He had bilateral arm pain with his myocardial infarction.  He mainly notes symptoms if he has been seated for a while and then gets up to do some type of activity.  He has associated shortness of breath.  He has not had syncope.  He has not had orthopnea, leg edema.  There does not seem to be any significant association with meals.  He does have a history of neck surgery.  His symptoms do not remind him of his neuropathic pain.  Review of Systems  Respiratory:  Negative for cough.   Gastrointestinal:  Negative for diarrhea, heartburn, hematochezia and melena.        Studies Reviewed:    EKG:  n/a   Risk Assessment/Calculations:              Physical Exam:   VS:  BP 120/80   Pulse 66   Ht '5\' 6"'$  (1.676 m)   Wt 168 lb  12.8 oz (76.6 kg)   SpO2 95%   BMI 27.25 kg/m    Wt Readings from Last 3 Encounters:  04/28/22 168 lb 12.8 oz (76.6 kg)  04/07/22 170 lb (77.1 kg)  03/29/22 170 lb 3.2 oz (77.2 kg)     Constitutional:      Appearance: Healthy appearance. Not in distress.  Neck:     Vascular: JVD normal.  Pulmonary:     Breath sounds: Normal breath sounds. No wheezing. No rales.  Cardiovascular:     Normal rate. Regular rhythm. Normal S1. Normal S2.      Murmurs: There is no murmur.  Edema:    Peripheral edema absent.  Abdominal:     Palpations: Abdomen is soft.       ASSESSMENT AND PLAN:   Coronary artery disease involving native coronary artery with angina pectoris ([place]) History of inferior STEMI in 10/21 treated with DES to the RCA and staged PCI with DES to the LAD.  Recent Myoview with inferior infarct but no ischemia.  Study was overall low risk.  He has had chest tightness and bilateral arm discomfort, mainly with exertion for many years.  This predates his heart attack.  He had similar symptoms with his heart attack.  Over the last several weeks, it does seem to have gotten worse.  Overall, his symptoms are difficult to characterize.  He does have associated shortness of breath.  We discussed the rationale for proceeding with cardiac catheterization versus medical therapy.  As his stress test was low risk, I favor medical therapy initially.  He agrees with this.  He does not take PDE-5 inhibitors. Start Imdur 15 mg daily Continue aspirin 81 mg daily, Lipitor 80 mg daily, metoprolol tartrate 25 mg twice daily Plan follow-up in 2 weeks If symptoms no better, consider cardiac catheterization  PVC's (premature ventricular contractions) He had rare PVCs and PACs on heart monitor in 2021.  His symptoms of chest discomfort do not seem to be associated with palpitations.  Continue metoprolol tartrate 25 mg twice daily.  Hyperlipidemia LDL goal <70 LDL in February 2024 was optimal at 13.  Continue  Lipitor 80 mg daily.        Dispo:  Return in about 2 weeks (around 05/12/2022) for Close Follow Up w/ Dr. Angelena Form, or Richardson Dopp, PA-C. Signed, Richardson Dopp, PA-C

## 2022-04-28 ENCOUNTER — Ambulatory Visit: Payer: BC Managed Care – PPO | Attending: Physician Assistant | Admitting: Physician Assistant

## 2022-04-28 ENCOUNTER — Encounter: Payer: Self-pay | Admitting: Physician Assistant

## 2022-04-28 VITALS — BP 120/80 | HR 66 | Ht 66.0 in | Wt 168.8 lb

## 2022-04-28 DIAGNOSIS — I493 Ventricular premature depolarization: Secondary | ICD-10-CM | POA: Diagnosis not present

## 2022-04-28 DIAGNOSIS — I2510 Atherosclerotic heart disease of native coronary artery without angina pectoris: Secondary | ICD-10-CM

## 2022-04-28 DIAGNOSIS — I25119 Atherosclerotic heart disease of native coronary artery with unspecified angina pectoris: Secondary | ICD-10-CM

## 2022-04-28 DIAGNOSIS — E785 Hyperlipidemia, unspecified: Secondary | ICD-10-CM

## 2022-04-28 MED ORDER — ISOSORBIDE MONONITRATE ER 30 MG PO TB24: 15.0000 mg | ORAL_TABLET | Freq: Every day | ORAL | 3 refills | Status: DC

## 2022-04-28 NOTE — Assessment & Plan Note (Signed)
History of inferior STEMI in 10/21 treated with DES to the RCA and staged PCI with DES to the LAD.  Recent Myoview with inferior infarct but no ischemia.  Study was overall low risk.  He has had chest tightness and bilateral arm discomfort, mainly with exertion for many years.  This predates his heart attack.  He had similar symptoms with his heart attack.  Over the last several weeks, it does seem to have gotten worse.  Overall, his symptoms are difficult to characterize.  He does have associated shortness of breath.  We discussed the rationale for proceeding with cardiac catheterization versus medical therapy.  As his stress test was low risk, I favor medical therapy initially.  He agrees with this.  He does not take PDE-5 inhibitors. Start Imdur 15 mg daily Continue aspirin 81 mg daily, Lipitor 80 mg daily, metoprolol tartrate 25 mg twice daily Plan follow-up in 2 weeks If symptoms no better, consider cardiac catheterization

## 2022-04-28 NOTE — Assessment & Plan Note (Signed)
LDL in February 2024 was optimal at 70.  Continue Lipitor 80 mg daily.

## 2022-04-28 NOTE — Assessment & Plan Note (Signed)
He had rare PVCs and PACs on heart monitor in 2021.  His symptoms of chest discomfort do not seem to be associated with palpitations.  Continue metoprolol tartrate 25 mg twice daily.

## 2022-04-28 NOTE — Patient Instructions (Signed)
Medication Instructions:  Your physician has recommended you make the following change in your medication:   START Isosorbide (Imdur) 30 mg taking only 1/2 tablet daily   *If you need a refill on your cardiac medications before your next appointment, please call your pharmacy*   Lab Work: None ordered  If you have labs (blood work) drawn today and your tests are completely normal, you will receive your results only by: [place] (if you have MyChart) OR A paper copy in the mail If you have any lab test that is abnormal or we need to change your treatment, we will call you to review the results.   Testing/Procedures: None ordered   Follow-Up: At [facility], you and your health needs are our priority.  As part of our continuing mission to provide you with exceptional heart care, we have created designated Provider Care Teams.  These Care Teams include your primary Cardiologist (physician) and Advanced Practice Providers (APPs -  Physician Assistants and Nurse Practitioners) who all work together to provide you with the care you need, when you need it.  We recommend signing up for the patient portal called "MyChart".  Sign up information is provided on this After Visit Summary.  MyChart is used to connect with patients for Virtual Visits (Telemedicine).  Patients are able to view lab/test results, encounter notes, upcoming appointments, etc.  Non-urgent messages can be sent to your provider as well.   To learn more about what you can do with MyChart, go to [email or web address].    Your next appointment:   2 week(s)  Provider:   Lauree Chandler, MD     Other Instructions

## 2022-05-08 ENCOUNTER — Telehealth: Payer: Self-pay | Admitting: Cardiovascular Disease

## 2022-05-08 NOTE — Telephone Encounter (Signed)
Wife states husband has not been on the medication long enough to know if it is effective and wants more time to evaluate new medication.  Wife wants call back from Dr. Camillia Herter RN directly on next steps.

## 2022-05-08 NOTE — Telephone Encounter (Signed)
Are his symptoms any better? Yes, if you want to bring him back on Gerald Stabs' next DOD day, that is fine. Thanks, [employer], [place]    05/08/2022 11:10 AM

## 2022-05-08 NOTE — Telephone Encounter (Signed)
The patient was seen 04/28/22 by APP.  He was started in IMDUR 15 mg for chest tightness and arm pain, associated SOB.  Symptoms are on exertion and not new.  Patient's wife cancelled his 2 week follow up (tomorrow) with Dr. Angelena Form.  Will route back to provider for further recommendations.

## 2022-05-08 NOTE — Telephone Encounter (Signed)
Sent message to APP re: this patient.  Per Nicki Reaper, okay to schedule the patient back with Dr. Angelena Form on next DOD day.   Scheduled him 05/19/22.   Picked up IMDUR and took starting last Tue, so he has had 6 doses so far.  He got a headache and started to get sick on Wed of last week.  He hasn't really felt good since.  No real noticeable changes in symptoms.  He will continue to monitor for this.   I adv he could change the IMDUR to bedtime from morning and try tylenol to help with headache if these are continuing.

## 2022-05-09 ENCOUNTER — Ambulatory Visit: Payer: BC Managed Care – PPO | Admitting: Cardiovascular Disease

## 2022-05-19 ENCOUNTER — Encounter: Payer: Self-pay | Admitting: Cardiovascular Disease

## 2022-05-19 ENCOUNTER — Ambulatory Visit: Payer: BC Managed Care – PPO | Attending: Cardiovascular Disease | Admitting: Cardiovascular Disease

## 2022-05-19 VITALS — BP 106/68 | HR 68 | Ht 66.0 in | Wt 171.2 lb

## 2022-05-19 DIAGNOSIS — E785 Hyperlipidemia, unspecified: Secondary | ICD-10-CM | POA: Diagnosis not present

## 2022-05-19 DIAGNOSIS — I493 Ventricular premature depolarization: Secondary | ICD-10-CM

## 2022-05-19 DIAGNOSIS — I25119 Atherosclerotic heart disease of native coronary artery with unspecified angina pectoris: Secondary | ICD-10-CM

## 2022-05-19 NOTE — Progress Notes (Signed)
Chief Complaint  Patient presents with   Follow-up    CAD   History of Present Illness: 57 yo male with history of CAD here today for cardiac follow up. He was admitted to [facility] in October 2021 with an acute inferior STEMI. Cardiac cath with thrombotic occlusion of the mid RCA, treated with a drug eluting stent. Staged PCI of the severe stenosis in the mid LAD, treated with a drug eluting stent. Echo 12/26/19 with LVEF=55-60%. No valve disease. Cardiac monitor November 2021 with PACs, PVCs and 4 beat run of SVT. He did not tolerate Brilinta secondary to dyspnea and chest pain. Exercise stress test in 2022 without ischemia. He was seen in our office in January 2024 by Venia Carbon NP with c/o chest pain that felt like the pain he has had for many years, not typical of the pain he had at the time of his MI. Nuclear stress test in February 2024 with inferior infarct without ischemia. He was seen in our office 04/28/22 by Richardson Dopp, PA and was started in Imdur.   He is here today for follow up. He has no chest pain with exertion at work but he continues to have a pressure sensation in his chest and arms when he first stands up to walk. This lasts for several minutes. This has been present for years before his MI in 2021. Several episodes of palpitations. The patient denies any dyspnea, lower extremity edema, orthopnea, PND, dizziness, near syncope or syncope.   Primary Care Physician: Patient, No Pcp Per  Past Medical History:  Diagnosis Date   Back pain    CAD (coronary artery disease)    a. inferior STEMI 11/2019 s/p DES to RCA with staged PCI/DES to LAD, normal LVEF.   Premature atrial contractions    PVC's (premature ventricular contractions)    SVT (supraventricular tachycardia)     Past Surgical History:  Procedure Laterality Date   BACK SURGERY     CORONARY STENT INTERVENTION N/A 12/26/2019   Procedure: CORONARY STENT INTERVENTION;  Surgeon: Burnell Blanks, MD;  Location:  [place] [facility];  Service: Cardiovascular;  Laterality: N/A;   CORONARY/GRAFT ACUTE MI REVASCULARIZATION N/A 12/25/2019   Procedure: CORONARY/GRAFT ACUTE MI REVASCULARIZATION;  Surgeon: Burnell Blanks, MD;  Location: [place] [facility];  Service: Cardiovascular;  Laterality: N/A;    Current Outpatient Medications  Medication Sig Dispense Refill   aspirin EC 81 MG tablet Take 81 mg by mouth daily. Swallow whole.     atorvastatin (LIPITOR) 80 MG tablet Take 1 tablet (80 mg total) by mouth daily at 6 PM. 90 tablet 3   isosorbide mononitrate (IMDUR) 30 MG 24 hr tablet Take 0.5 tablets (15 mg total) by mouth daily. 45 tablet 3   metoprolol tartrate (LOPRESSOR) 25 MG tablet Take 1 tablet (25 mg total) by mouth 2 (two) times daily. 180 tablet 3   Multiple Vitamins-Minerals (MULTIVITAMIN ADULTS 50+ PO) Take 1 tablet by mouth daily.     nitroGLYCERIN (NITROSTAT) 0.4 MG SL tablet PLACE 1 TABLET UNDER THE TONGUE EVERY 5 MINUTES AS NEEDED FOR CHEST PAIN 25 tablet 3   No current facility-administered medications for this visit.    No Known Allergies  Social History   Socioeconomic History   Marital status: Married    Spouse name: Not on file   Number of children: Not on file   Years of education: Not on file   Highest education level: Not on file  Occupational History  Not on file  Tobacco Use   Smoking status: Former    Packs/day: 2.00    Years: 30.00    Additional pack years: 0.00    Total pack years: 60.00    Types: Cigarettes   Smokeless tobacco: Former  Substance and Sexual Activity   Alcohol use: Not on file   Drug use: Not on file   Sexual activity: Not on file  Other Topics Concern   Not on file  Social History Narrative   Not on file   Social Determinants of Health   Financial Resource Strain: Not on file  Food Insecurity: Not on file  Transportation Needs: Not on file  Physical Activity: Not on file  Stress: Not on file  Social Connections: Not on file   Intimate Partner Violence: Not on file    Family History  Problem Relation Age of Onset   CAD Other     Review of Systems:  As stated in the HPI and otherwise negative.   BP 106/68   Pulse 68   Ht 5\' 6"  (1.676 m)   Wt 77.7 kg   SpO2 98%   BMI 27.63 kg/m   Physical Examination:  General: Well developed, well nourished, NAD  HEENT: OP clear, mucus membranes moist  SKIN: warm, dry. No rashes. Neuro: No focal deficits  Musculoskeletal: Muscle strength 5/5 all ext  Psychiatric: Mood and affect normal  Neck: No JVD, no carotid bruits, no thyromegaly, no lymphadenopathy.  Lungs:Clear bilaterally, no wheezes, rhonci, crackles Cardiovascular: Regular rate and rhythm. No murmurs, gallops or rubs. Abdomen:Soft. Bowel sounds present. Non-tender.  Extremities: No lower extremity edema. Pulses are 2 + in the bilateral DP/PT.  EKG:  EKG is not ordered today. The ekg ordered today demonstrates   Echo 12/26/19:  1. Left ventricular ejection fraction, by estimation, is 55 to 60%. The  left ventricle has normal function. The left ventricle has no regional  wall motion abnormalities. Left ventricular diastolic parameters were  normal.   2. Right ventricular systolic function is normal. The right ventricular  size is normal. Tricuspid regurgitation signal is inadequate for assessing  PA pressure.   3. The mitral valve is grossly normal. Trivial mitral valve  regurgitation. No evidence of mitral stenosis.   4. The aortic valve is tricuspid. Aortic valve regurgitation is not  visualized. No aortic stenosis is present.   5. The inferior vena cava is normal in size with greater than 50%  respiratory variability, suggesting right atrial pressure of 3 mmHg.   Recent Labs: 04/07/2022: ALT 35; BUN 21; Creatinine, Ser 0.93; Potassium 4.3; Sodium 138   Lipid Panel    Component Value Date/Time   CHOL 131 04/07/2022 1014   TRIG 67 04/07/2022 1014   HDL 50 04/07/2022 1014   CHOLHDL 2.6  04/07/2022 1014   CHOLHDL 5.4 12/25/2019 1450   VLDL 33 12/25/2019 1450   LDLCALC 67 04/07/2022 1014     Wt Readings from Last 3 Encounters:  05/19/22 77.7 kg  04/28/22 76.6 kg  04/07/22 77.1 kg    Assessment and Plan:   1. CAD with angina: He continues to have some chest pressure when first standing after sitting but this is improved on Imdur. He has no chest pain all during the day at work. His stress test did not show ischemia. I have told him today that I can only fully exclude progression of CAD with a cardiac cath. We have decided today to continue Imdur for now. I will see  him back in 4 weeks and if he is still having these symptoms, will plan a cardiac cath then. Continue ASA, statin, beta blocker and Imdur.    2. Hyperlipidemia: LDL at goal in February 2024. Continue statin.    3. History of PACs/PVCs: He has had several episodes of palpitations. Continue beta blocker  Labs/ tests ordered today include:  No orders of the defined types were placed in this encounter.  Disposition:   F/U with me in 12 months.    Signed, Lauree Chandler, MD 05/19/2022 10:13 AM    [facility] [place], [place], [place]  [address] Phone: [phone]; Fax: [phone]

## 2022-05-19 NOTE — Patient Instructions (Signed)
Medication Instructions:  No changes *If you need a refill on your cardiac medications before your next appointment, please call your pharmacy*   Lab Work: none If you have labs (blood work) drawn today and your tests are completely normal, you will receive your results only by: [place] (if you have MyChart) OR A paper copy in the mail If you have any lab test that is abnormal or we need to change your treatment, we will call you to review the results.   Testing/Procedures: No changes   Follow-Up: At [facility], you and your health needs are our priority.  As part of our continuing mission to provide you with exceptional heart care, we have created designated Provider Care Teams.  These Care Teams include your primary Cardiologist (physician) and Advanced Practice Providers (APPs -  Physician Assistants and Nurse Practitioners) who all work together to provide you with the care you need, when you need it.    Your next appointment:   4-6 week(s)  Provider:   Lauree Chandler, MD

## 2022-06-12 ENCOUNTER — Ambulatory Visit: Payer: BC Managed Care – PPO | Admitting: Cardiovascular Disease

## 2022-10-14 DIAGNOSIS — M545 Low back pain, unspecified: Secondary | ICD-10-CM | POA: Insufficient documentation

## 2022-10-23 DIAGNOSIS — M9905 Segmental and somatic dysfunction of pelvic region: Secondary | ICD-10-CM | POA: Diagnosis not present

## 2022-10-23 DIAGNOSIS — M9903 Segmental and somatic dysfunction of lumbar region: Secondary | ICD-10-CM | POA: Diagnosis not present

## 2022-10-23 DIAGNOSIS — M9902 Segmental and somatic dysfunction of thoracic region: Secondary | ICD-10-CM | POA: Diagnosis not present

## 2022-10-23 DIAGNOSIS — M940 Chondrocostal junction syndrome [Tietze]: Secondary | ICD-10-CM | POA: Diagnosis not present

## 2022-10-23 DIAGNOSIS — M5137 Other intervertebral disc degeneration, lumbosacral region: Secondary | ICD-10-CM | POA: Diagnosis not present

## 2022-10-24 DIAGNOSIS — M9903 Segmental and somatic dysfunction of lumbar region: Secondary | ICD-10-CM | POA: Diagnosis not present

## 2022-10-24 DIAGNOSIS — M9902 Segmental and somatic dysfunction of thoracic region: Secondary | ICD-10-CM | POA: Diagnosis not present

## 2022-10-24 DIAGNOSIS — M9905 Segmental and somatic dysfunction of pelvic region: Secondary | ICD-10-CM | POA: Diagnosis not present

## 2022-10-31 DIAGNOSIS — M545 Low back pain, unspecified: Secondary | ICD-10-CM | POA: Diagnosis not present

## 2022-11-08 DIAGNOSIS — M545 Low back pain, unspecified: Secondary | ICD-10-CM | POA: Diagnosis not present

## 2022-11-14 DIAGNOSIS — M5451 Vertebrogenic low back pain: Secondary | ICD-10-CM | POA: Diagnosis not present

## 2023-04-23 ENCOUNTER — Other Ambulatory Visit: Payer: Self-pay | Admitting: Physician Assistant

## 2023-05-07 ENCOUNTER — Other Ambulatory Visit: Payer: Self-pay | Admitting: Physician Assistant

## 2023-05-18 ENCOUNTER — Ambulatory Visit: Payer: BC Managed Care – PPO | Attending: Cardiovascular Disease | Admitting: Cardiovascular Disease

## 2023-05-18 ENCOUNTER — Encounter: Payer: Self-pay | Admitting: Cardiovascular Disease

## 2023-05-18 VITALS — BP 126/76 | HR 62 | Ht 66.0 in | Wt 168.4 lb

## 2023-05-18 DIAGNOSIS — I493 Ventricular premature depolarization: Secondary | ICD-10-CM | POA: Diagnosis not present

## 2023-05-18 DIAGNOSIS — E785 Hyperlipidemia, unspecified: Secondary | ICD-10-CM | POA: Diagnosis not present

## 2023-05-18 DIAGNOSIS — I25119 Atherosclerotic heart disease of native coronary artery with unspecified angina pectoris: Secondary | ICD-10-CM

## 2023-05-18 MED ORDER — ISOSORBIDE MONONITRATE ER 30 MG PO TB24: 30.0000 mg | ORAL_TABLET | Freq: Every day | ORAL | 3 refills | Status: AC

## 2023-05-18 NOTE — Patient Instructions (Addendum)
 Medication Instructions:  Your physician has recommended you make the following change in your medication:  1.) increase IMDUR to 30 mg - one tablet daily  *If you need a refill on your cardiac medications before your next appointment, please call your pharmacy*   Lab Work: Go to any lab corp for blood work - lipids, liver function  If you have labs (blood work) drawn today and your tests are completely normal, you will receive your results only by: MyChart Message (if you have MyChart) OR A paper copy in the mail If you have any lab test that is abnormal or we need to change your treatment, we will call you to review the results.   Testing/Procedures: none   Follow-Up: At [facility], you and your health needs are our priority.  As part of our continuing mission to provide you with exceptional heart care, we have created designated Provider Care Teams.  These Care Teams include your primary Cardiologist (physician) and Advanced Practice Providers (APPs -  Physician Assistants and Nurse Practitioners) who all work together to provide you with the care you need, when you need it.   Your next appointment:   12 month(s)  Provider:   Verne Carrow, MD     Other Instructions

## 2023-05-18 NOTE — Progress Notes (Signed)
 Chief Complaint  Patient presents with   Follow-up    CAD   History of Present Illness: 58 yo male with history of CAD, SVT, PACs, PVCs here today for cardiac follow up. He was admitted to [facility] in October 2021 with an acute inferior STEMI. Cardiac cath with thrombotic occlusion of the mid RCA, treated with a drug eluting stent. Staged PCI of the severe stenosis in the mid LAD, treated with a drug eluting stent. Echo 12/26/19 with LVEF=55-60%. No valve disease. Cardiac monitor November 2021 with PACs, PVCs and 4 beat run of SVT. He did not tolerate Brilinta secondary to dyspnea and chest pain. Exercise stress test in 2022 without ischemia. He was seen in our office in January 2024 by Wallis Bamberg NP with c/o chest pain that felt like the pain he has had for many years, not typical of the pain he had at the time of his MI. Nuclear stress test in February 2024 with inferior infarct without ischemia. He was seen in our office 04/28/22 by Tereso Newcomer, PA and was started in Imdur.   He is here today for follow up. He tells me that he continues to have chest pressure and arm pain when he first starts walking but this goes away after a minute and does not recur with moderate to heavy exertion. He walks on a treadmill and has no pain. He denies dyspnea, palpitations, lower extremity edema, orthopnea, PND, dizziness, near syncope or syncope.   Primary Care Physician: Patient, No Pcp Per  Past Medical History:  Diagnosis Date   Back pain    CAD (coronary artery disease)    a. inferior STEMI 11/2019 s/p DES to RCA with staged PCI/DES to LAD, normal LVEF.   Premature atrial contractions    PVC's (premature ventricular contractions)    SVT (supraventricular tachycardia) (HCC)     Past Surgical History:  Procedure Laterality Date   BACK SURGERY     CORONARY STENT INTERVENTION N/A 12/26/2019   Procedure: CORONARY STENT INTERVENTION;  Surgeon: Kathleene Hazel, MD;  Location: [facility];   Service: Cardiovascular;  Laterality: N/A;   CORONARY/GRAFT ACUTE MI REVASCULARIZATION N/A 12/25/2019   Procedure: CORONARY/GRAFT ACUTE MI REVASCULARIZATION;  Surgeon: Kathleene Hazel, MD;  Location: [facility];  Service: Cardiovascular;  Laterality: N/A;    Current Outpatient Medications  Medication Sig Dispense Refill   aspirin EC 81 MG tablet Take 81 mg by mouth daily. Swallow whole.     atorvastatin (LIPITOR) 80 MG tablet TAKE 1 TABLET BY MOUTH DAILY AT 6PM 90 tablet 0   isosorbide mononitrate (IMDUR) 30 MG 24 hr tablet Take 1 tablet (30 mg total) by mouth daily. 90 tablet 3   meloxicam (MOBIC) 15 MG tablet Take 15 mg by mouth daily.     methocarbamol (ROBAXIN) 500 MG tablet Take 500 mg by mouth as needed for muscle spasms.     metoprolol tartrate (LOPRESSOR) 25 MG tablet Take 1 tablet (25 mg total) by mouth 2 (two) times daily. 180 tablet 3   Multiple Vitamins-Minerals (MULTIVITAMIN ADULTS 50+ PO) Take 1 tablet by mouth daily.     nitroGLYCERIN (NITROSTAT) 0.4 MG SL tablet PLACE 1 TABLET UNDER THE TONGUE EVERY 5 MINUTES AS NEEDED FOR CHEST PAIN 25 tablet 3   predniSONE (DELTASONE) 5 MG tablet Take 5 mg by mouth as needed.     No current facility-administered medications for this visit.    No Known Allergies  Social History  Socioeconomic History   Marital status: Married    Spouse name: Not on file   Number of children: Not on file   Years of education: Not on file   Highest education level: Not on file  Occupational History   Not on file  Tobacco Use   Smoking status: Former    Current packs/day: 2.00    Average packs/day: 2.0 packs/day for 30.0 years (60.0 ttl pk-yrs)    Types: Cigarettes   Smokeless tobacco: Former  Substance and Sexual Activity   Alcohol use: Not on file   Drug use: Not on file   Sexual activity: Not on file  Other Topics Concern   Not on file  Social History Narrative   Not on file   Social Drivers of Health   Financial  Resource Strain: Not on file  Food Insecurity: Not on file  Transportation Needs: Not on file  Physical Activity: Not on file  Stress: Not on file  Social Connections: Unknown (02/13/2022)   Received from [facility]   Social Network    Social Network: Not on file  Intimate Partner Violence: Unknown (02/13/2022)   Received from [facility]   HITS    Physically Hurt: Not on file    Insult or Talk Down To: Not on file    Threaten Physical Harm: Not on file    Scream or Curse: Not on file    Family History  Problem Relation Age of Onset   CAD Other     Review of Systems:  As stated in the HPI and otherwise negative.   BP 126/76   Pulse 62   Ht 5\' 6"  (1.676 m)   Wt 76.4 kg   SpO2 95%   BMI 27.18 kg/m   Physical Examination: General: Well developed, well nourished, NAD  HEENT: OP clear, mucus membranes moist  SKIN: warm, dry. No rashes. Neuro: No focal deficits  Musculoskeletal: Muscle strength 5/5 all ext  Psychiatric: Mood and affect normal  Neck: No JVD, no carotid bruits, no thyromegaly, no lymphadenopathy.  Lungs:Clear bilaterally, no wheezes, rhonci, crackles Cardiovascular: Regular rate and rhythm. No murmurs, gallops or rubs. Abdomen:Soft. Bowel sounds present. Non-tender.  Extremities: No lower extremity edema. Pulses are 2 + in the bilateral DP/PT.  EKG:  EKG is ordered today. The ekg ordered today demonstrates  EKG Interpretation Date/Time:  Friday May 18 2023 08:29:13 EDT Ventricular Rate:  62 PR Interval:  138 QRS Duration:  86 QT Interval:  368 QTC Calculation: 373 R Axis:   63  Text Interpretation: Normal sinus rhythm Normal ECG Confirmed by Verne Carrow [phone]) on 05/18/2023 8:55:57 AM    Recent Labs: No results found for requested labs within last 365 days.   Lipid Panel    Component Value Date/Time   CHOL 131 04/07/2022 1014   TRIG 67 04/07/2022 1014   HDL 50 04/07/2022 1014   CHOLHDL 2.6  04/07/2022 1014   CHOLHDL 5.4 12/25/2019 1450   VLDL 33 12/25/2019 1450   LDLCALC 67 04/07/2022 1014     Wt Readings from Last 3 Encounters:  05/18/23 76.4 kg  05/19/22 77.7 kg  04/28/22 76.6 kg    Assessment and Plan:   1. CAD with angina: He has ongoing mild chest pressure when he first starts walking but this goes away and does not return with moderate to heavy exertion. This occurs once per week and is improved on Imdur 15 mg daily. He has had this complaint  since his MI in 2021. Normal stress test in 2024. EKG is normal today. This atypical pain is either chronic stable angina due to microvascular disease or not cardiac related. I will increase Imdur to 30 mg daily. He will call back in several weeks and if still having the pain, we can consider a cardiac cath to exclude obstructive CAD. No chest pain suggestive of angina. Continue ASA, statin, Imdur and beta blocker.     2. Hyperlipidemia: LDL at goal in February 2024. Continue statin. Repeat lipids and LFTs.   3. History of PACs/PVCs: Rare palpitations. Continue beta blocker.   Labs/ tests ordered today include:   Orders Placed This Encounter  Procedures   Lipid panel   Hepatic function panel   EKG 12-Lead   Disposition:   F/U with me in 12 months.   Signed, Verne Carrow, MD 05/18/2023 9:33 AM    [facility] [address] [place], [place], [place]  [address] Phone: [phone]; Fax: [phone]

## 2023-05-21 ENCOUNTER — Other Ambulatory Visit: Payer: Self-pay | Admitting: Physician Assistant

## 2023-05-28 ENCOUNTER — Other Ambulatory Visit: Payer: Self-pay | Admitting: Physician Assistant

## 2023-06-25 ENCOUNTER — Telehealth: Payer: Self-pay | Admitting: Cardiovascular Disease

## 2023-06-25 NOTE — Telephone Encounter (Signed)
 Spoke to patient he stated he is ready to schedule cardiac cath.Advised I will send message to Dr.McAlhany's RN.

## 2023-06-25 NOTE — Telephone Encounter (Signed)
 Pt said he is ready to schedule a cath. Please advise

## 2023-06-29 NOTE — Telephone Encounter (Signed)
 Patient scheduled 07/03/23 with APP.

## 2023-07-03 ENCOUNTER — Ambulatory Visit: Admitting: Nurse Practitioner

## 2023-07-17 DIAGNOSIS — M79674 Pain in right toe(s): Secondary | ICD-10-CM | POA: Insufficient documentation

## 2023-08-02 DIAGNOSIS — S92919A Unspecified fracture of unspecified toe(s), initial encounter for closed fracture: Secondary | ICD-10-CM | POA: Insufficient documentation

## 2023-08-06 ENCOUNTER — Telehealth: Payer: Self-pay | Admitting: Cardiovascular Disease

## 2023-08-06 MED ORDER — ATORVASTATIN CALCIUM 80 MG PO TABS: 80.0000 mg | ORAL_TABLET | Freq: Every day | ORAL | 2 refills | Status: AC

## 2023-08-06 NOTE — Telephone Encounter (Signed)
 Pt's medication was sent to pt's pharmacy as requested. Confirmation received.

## 2023-08-06 NOTE — Telephone Encounter (Signed)
*  STAT* If patient is at the pharmacy, call can be transferred to refill team.   1. Which medications need to be refilled? (please list name of each medication and dose if known) Atorvastatin    2. Would you like to learn more about the convenience, safety, & potential cost savings by using the [facility]?     3. Are you open to using the [facility] (Type [facility].    4. Which pharmacy/location (including street and city if local pharmacy) is medication to be sent to?[facility] [place]   5. Do they need a 30 day or 90 day supply? 90 days and refills

## 2023-09-06 DIAGNOSIS — M79675 Pain in left toe(s): Secondary | ICD-10-CM | POA: Insufficient documentation

## 2023-09-11 DIAGNOSIS — S90852A Superficial foreign body, left foot, initial encounter: Secondary | ICD-10-CM | POA: Diagnosis not present

## 2023-09-15 DIAGNOSIS — S90852A Superficial foreign body, left foot, initial encounter: Secondary | ICD-10-CM | POA: Diagnosis not present

## 2023-09-15 DIAGNOSIS — S90851A Superficial foreign body, right foot, initial encounter: Secondary | ICD-10-CM | POA: Diagnosis not present

## 2023-09-24 ENCOUNTER — Ambulatory Visit: Attending: Physician Assistant | Admitting: Physician Assistant

## 2023-09-24 ENCOUNTER — Encounter: Payer: Self-pay | Admitting: Physician Assistant

## 2023-09-24 VITALS — BP 118/78 | HR 73 | Ht 66.0 in | Wt 167.2 lb

## 2023-09-24 DIAGNOSIS — I25119 Atherosclerotic heart disease of native coronary artery with unspecified angina pectoris: Secondary | ICD-10-CM | POA: Diagnosis not present

## 2023-09-24 DIAGNOSIS — E785 Hyperlipidemia, unspecified: Secondary | ICD-10-CM

## 2023-09-24 NOTE — Assessment & Plan Note (Signed)
 Hyperlipidemia with optimal LDL at 67 in February 2024. Managed with Lipitor  80 mg daily. - Continue Lipitor  80 mg daily

## 2023-09-24 NOTE — Assessment & Plan Note (Signed)
 History of inferior STEMI in October 2021 treated with DES to mid RCA and staged PCI with DES to mid LAD. Nuclear stress test in February 2024 showed inferior and infraseptal infarct with no ischemia.  Exertional chest discomfort persists despite multiple anti-anginal medications.  Cardiac catheterization has been recommended.. - Continue aspirin  81 mg daily - Continue Lipitor  80 mg daily - Continue Imdur  15 mg daily - Continue metoprolol  tartrate 25 mg twice daily - Use nitroglycerin  as needed - Arrange cardiac catheterization

## 2023-09-24 NOTE — Progress Notes (Signed)
 OFFICE NOTE:    Date:  09/24/2023  ID:  Marvin Velasquez, DOB [birth year], MRN [id number] PCP: Patient, No Pcp Per   HeartCare Providers Cardiologist:  Marvin Cash, MD        Coronary artery disease  Inferior STEMI 12/25/2019 s/p 3 x 16 mm DES to the mid RCA Staged PCI 12/26/2019: Mid LAD 80, proximal LCx 20, mid RCA stent patent; s/p 2.5 x 20 mm DES to the mid LAD TTE 12/26/2019: EF 55-60, no RWMA, normal RVSF, trivial MR, RAP 3 ETT 06/14/20: normal  Myoview  04/07/2022: Inferior and inferoseptal infarct, no ischemia, normal EF, low risk PVCs, PACs, SVT Monitor 01/27/2020: 4 beat run SVT, rare PACs, rare PVCs, NSR Hyperlipidemia        Discussed the use of AI scribe software for clinical note transcription with the patient, who gave verbal consent to proceed. History of Present Illness Marvin Velasquez is a 58 year old male with coronary artery disease who presents with ongoing chest pressure with exertion.  He was last seen by Dr. Cash in March 2025.  At that time, his isosorbide  was increased with plans to pursue cardiac catheterization if he did not have relief.  He experiences ongoing chest pressure with exertion, particularly when starting physical activity after rest. Chest pain described as tightness/heaviness and bilateral arm pain occur after walking 30-40 yards, subsiding upon stopping and resuming with slower activity. Symptoms are more pronounced after extended breaks. No symptoms at rest, but occasional shortness of breath when starting activity.  The increased dose of isosorbide  did not provide much relief.  He resumed taking isosorbide  mononitrate 15 mg daily.   Review of Systems  Gastrointestinal:  Negative for hematochezia and melena.  Genitourinary:  Negative for hematuria.  -See HPI     Studies Reviewed:  EKG Interpretation Date/Time:  Monday September 24 2023 15:11:03 EDT Ventricular Rate:  67 PR Interval:  104 QRS Duration:  80 QT  Interval:  368 QTC Calculation: 388 R Axis:   0  Text Interpretation: Sinus rhythm with short PR No significant change since last tracing Confirmed by Marvin Velasquez [phone]) on 09/24/2023 3:47:15 PM   Results LABS LDL: 67 (03/2022)         Physical Exam:  VS:  BP 118/78   Pulse 73   Ht 5' 6 (1.676 m)   Wt 167 lb 3.2 oz (75.8 kg)   SpO2 97%   BMI 26.99 kg/m        Wt Readings from Last 3 Encounters:  09/24/23 167 lb 3.2 oz (75.8 kg)  05/18/23 168 lb 6.4 oz (76.4 kg)  05/19/22 171 lb 3.2 oz (77.7 kg)    Constitutional:      Appearance: Healthy appearance. Not in distress.  Neck:     Vascular: JVD normal.  Pulmonary:     Breath sounds: Normal breath sounds. No wheezing. No rales.  Cardiovascular:     Normal rate. Regular rhythm.     Murmurs: There is no murmur.  Edema:    Peripheral edema absent.  Abdominal:     Palpations: Abdomen is soft.       Assessment and Plan:    Assessment & Plan Coronary artery disease involving native coronary artery of native heart with angina pectoris [name]) History of inferior STEMI in October 2021 treated with DES to mid RCA and staged PCI with DES to mid LAD. Nuclear stress test in February 2024 showed inferior and infraseptal infarct with no ischemia.  Exertional chest discomfort persists despite multiple anti-anginal medications.  Cardiac catheterization has been recommended.. - Continue aspirin  81 mg daily - Continue Lipitor  80 mg daily - Continue Imdur  15 mg daily - Continue metoprolol  tartrate 25 mg twice daily - Use nitroglycerin  as needed - Arrange cardiac catheterization Hyperlipidemia LDL goal <70 Hyperlipidemia with optimal LDL at 67 in February 2024. Managed with Lipitor  80 mg daily. - Continue Lipitor  80 mg daily   Informed Consent   Shared Decision Making/Informed Consent The risks [stroke (1 in 1000), death (1 in 1000), kidney failure [usually temporary] (1 in 500), bleeding (1 in 200), allergic reaction [possibly  serious] (1 in 200)], benefits (diagnostic support and management of coronary artery disease) and alternatives of a cardiac catheterization were discussed in detail with Marvin Velasquez and he is willing to proceed.     Dispo:  Return for Post Procedure Follow Up.  Signed, Marvin Ferrier, PA-C

## 2023-09-24 NOTE — Patient Instructions (Signed)
 Medication Instructions:  Your physician recommends that you continue on your current medications as directed. Please refer to the Current Medication list given to you today.  *If you need a refill on your cardiac medications before your next appointment, please call your pharmacy*  Lab Work: WITHIN NEXT FEW DAYS, BEFORE FRIDAY, COME BACK TO THE LAB, FASTING:  LIPID, LFT, BMET, & CBC  If you have labs (blood work) drawn today and your tests are completely normal, you will receive your results only by: MyChart Message (if you have MyChart) OR A paper copy in the mail If you have any lab test that is abnormal or we need to change your treatment, we will call you to review the results.  Testing/Procedures: Your physician has requested that you have a cardiac catheterization. Cardiac catheterization is used to diagnose and/or treat various heart conditions. Doctors may recommend this procedure for a number of different reasons. The most common reason is to evaluate chest pain. Chest pain can be a symptom of coronary artery disease (CAD), and cardiac catheterization can show whether plaque is narrowing or blocking your heart's arteries. This procedure is also used to evaluate the valves, as well as measure the blood flow and oxygen levels in different parts of your heart. For further information please visit [email or web address] Please follow instruction sheet, BELOW:        Cardiac/Peripheral Catheterization   You are scheduled for a Cardiac Catheterization on Wednesday, August 6 with Dr. Lonni Cash.  1. Please arrive at the [facility] (Main Entrance A) at [facility]: [address] [place], [place] [address] at 6:30 AM (This time is 2 hour(s) before your procedure to ensure your preparation).   Free valet parking service is available. You will check in at ADMITTING. The support person will be asked to wait in the waiting room.  It is OK to have someone drop you off and  come back when you are ready to be discharged.        Special note: Every effort is made to have your procedure done on time. Please understand that emergencies sometimes delay scheduled procedures.  2. Diet: Do not eat solid foods after midnight.  You may have clear liquids until 5 AM the day of the procedure.  3. Labs:  BEFORE FRIDAY THIS WEEK   4. Medication instructions in preparation for your procedure:   Contrast Allergy: No  On the morning of your procedure, take Aspirin  81 mg and any morning medicines NOT listed above.  You may use sips of water.  5. Plan to go home the same day, you will only stay overnight if medically necessary. 6. You MUST have a responsible adult to drive you home. 7. An adult MUST be with you the first 24 hours after you arrive home. 8. Bring a current list of your medications, and the last time and date medication taken. 9. Bring ID and current insurance cards. 10.Please wear clothes that are easy to get on and off and wear slip-on shoes.  Thank you for allowing us  to care for you!   -- [facility]   Follow-Up: At [facility], you and your health needs are our priority.  As part of our continuing mission to provide you with exceptional heart care, our providers are all part of one team.  This team includes your primary Cardiologist (physician) and Advanced Practice Providers or APPs (Physician Assistants and Nurse Practitioners) who all work together to provide you  with the care you need, when you need it.  Your next appointment:   2 week(s)  Provider:   Lonni Cash, MD or Glendia Ferrier, PA-C          We recommend signing up for the patient portal called MyChart.  Sign up information is provided on this After Visit Summary.  MyChart is used to connect with patients for Virtual Visits (Telemedicine).  Patients are able to view lab/test results, encounter notes, upcoming appointments, etc.   Non-urgent messages can be sent to your provider as well.   To learn more about what you can do with MyChart, go to [email or web address].   Other Instructions

## 2023-09-24 NOTE — H&P (View-Only) (Signed)
 OFFICE NOTE:    Date:  09/24/2023  ID:  Marvin Velasquez, DOB [birth year], MRN [id number] PCP: Patient, No Pcp Per   HeartCare Providers Cardiologist:  Lonni Cash, MD        Coronary artery disease  Inferior STEMI 12/25/2019 s/p 3 x 16 mm DES to the mid RCA Staged PCI 12/26/2019: Mid LAD 80, proximal LCx 20, mid RCA stent patent; s/p 2.5 x 20 mm DES to the mid LAD TTE 12/26/2019: EF 55-60, no RWMA, normal RVSF, trivial MR, RAP 3 ETT 06/14/20: normal  Myoview  04/07/2022: Inferior and inferoseptal infarct, no ischemia, normal EF, low risk PVCs, PACs, SVT Monitor 01/27/2020: 4 beat run SVT, rare PACs, rare PVCs, NSR Hyperlipidemia        Discussed the use of AI scribe software for clinical note transcription with the patient, who gave verbal consent to proceed. History of Present Illness Marvin Velasquez is a 58 year old male with coronary artery disease who presents with ongoing chest pressure with exertion.  He was last seen by Dr. Cash in March 2025.  At that time, his isosorbide  was increased with plans to pursue cardiac catheterization if he did not have relief.  He experiences ongoing chest pressure with exertion, particularly when starting physical activity after rest. Chest pain described as tightness/heaviness and bilateral arm pain occur after walking 30-40 yards, subsiding upon stopping and resuming with slower activity. Symptoms are more pronounced after extended breaks. No symptoms at rest, but occasional shortness of breath when starting activity.  The increased dose of isosorbide  did not provide much relief.  He resumed taking isosorbide  mononitrate 15 mg daily.   Review of Systems  Gastrointestinal:  Negative for hematochezia and melena.  Genitourinary:  Negative for hematuria.  -See HPI     Studies Reviewed:  EKG Interpretation Date/Time:  Monday September 24 2023 15:11:03 EDT Ventricular Rate:  67 PR Interval:  104 QRS Duration:  80 QT  Interval:  368 QTC Calculation: 388 R Axis:   0  Text Interpretation: Sinus rhythm with short PR No significant change since last tracing Confirmed by Lelon Hamilton [phone]) on 09/24/2023 3:47:15 PM   Results LABS LDL: 67 (03/2022)         Physical Exam:  VS:  BP 118/78   Pulse 73   Ht 5' 6 (1.676 m)   Wt 167 lb 3.2 oz (75.8 kg)   SpO2 97%   BMI 26.99 kg/m        Wt Readings from Last 3 Encounters:  09/24/23 167 lb 3.2 oz (75.8 kg)  05/18/23 168 lb 6.4 oz (76.4 kg)  05/19/22 171 lb 3.2 oz (77.7 kg)    Constitutional:      Appearance: Healthy appearance. Not in distress.  Neck:     Vascular: JVD normal.  Pulmonary:     Breath sounds: Normal breath sounds. No wheezing. No rales.  Cardiovascular:     Normal rate. Regular rhythm.     Murmurs: There is no murmur.  Edema:    Peripheral edema absent.  Abdominal:     Palpations: Abdomen is soft.       Assessment and Plan:    Assessment & Plan Coronary artery disease involving native coronary artery of native heart with angina pectoris [name]) History of inferior STEMI in October 2021 treated with DES to mid RCA and staged PCI with DES to mid LAD. Nuclear stress test in February 2024 showed inferior and infraseptal infarct with no ischemia.  Exertional chest discomfort persists despite multiple anti-anginal medications.  Cardiac catheterization has been recommended.. - Continue aspirin  81 mg daily - Continue Lipitor  80 mg daily - Continue Imdur  15 mg daily - Continue metoprolol  tartrate 25 mg twice daily - Use nitroglycerin  as needed - Arrange cardiac catheterization Hyperlipidemia LDL goal <70 Hyperlipidemia with optimal LDL at 67 in February 2024. Managed with Lipitor  80 mg daily. - Continue Lipitor  80 mg daily   Informed Consent   Shared Decision Making/Informed Consent The risks [stroke (1 in 1000), death (1 in 1000), kidney failure [usually temporary] (1 in 500), bleeding (1 in 200), allergic reaction [possibly  serious] (1 in 200)], benefits (diagnostic support and management of coronary artery disease) and alternatives of a cardiac catheterization were discussed in detail with Mr. Vora and he is willing to proceed.     Dispo:  Return for Post Procedure Follow Up.  Signed, Glendia Ferrier, PA-C

## 2023-09-26 DIAGNOSIS — I25119 Atherosclerotic heart disease of native coronary artery with unspecified angina pectoris: Secondary | ICD-10-CM | POA: Diagnosis not present

## 2023-09-26 DIAGNOSIS — E785 Hyperlipidemia, unspecified: Secondary | ICD-10-CM | POA: Diagnosis not present

## 2023-09-26 LAB — CBC

## 2023-09-26 LAB — LIPID PANEL

## 2023-09-27 ENCOUNTER — Ambulatory Visit: Payer: Self-pay | Admitting: Cardiovascular Disease

## 2023-09-27 ENCOUNTER — Ambulatory Visit: Payer: Self-pay | Admitting: Physician Assistant

## 2023-09-27 LAB — CBC
Hematocrit: 47.7 (ref 37.5–51.0)
Hemoglobin: 15.9 g/dL (ref 13.0–17.7)
MCH: 33.3 pg — AB (ref 26.6–33.0)
MCHC: 33.3 g/dL (ref 31.5–35.7)
MCV: 100 fL — AB (ref 79–97)
Platelets: 193 x10E3/uL (ref 150–450)
RBC: 4.77 x10E6/uL (ref 4.14–5.80)
RDW: 12.1 (ref 11.6–15.4)
WBC: 5.3 x10E3/uL (ref 3.4–10.8)

## 2023-09-27 LAB — LIPID PANEL
Cholesterol, Total: 128 mg/dL (ref 100–199)
HDL: 42 mg/dL (ref >39)
LDL CALC COMMENT:: 3 ratio (ref 0.0–5.0)
LDL Chol Calc (NIH): 64 mg/dL (ref 0–99)
Triglycerides: 120 mg/dL (ref 0–149)
VLDL Cholesterol Cal: 22 mg/dL (ref 5–40)

## 2023-09-27 LAB — HEPATIC FUNCTION PANEL
ALT: 23 IU/L (ref 0–44)
AST: 30 IU/L (ref 0–40)
Albumin: 4.3 g/dL (ref 3.8–4.9)
Alkaline Phosphatase: 72 IU/L (ref 44–121)
Bilirubin Total: 0.6 mg/dL (ref 0.0–1.2)
Bilirubin, Direct: 0.18 mg/dL (ref 0.00–0.40)
Total Protein: 6.1 g/dL (ref 6.0–8.5)

## 2023-09-27 LAB — BASIC METABOLIC PANEL WITH GFR
BUN/Creatinine Ratio: 18 (ref 9–20)
BUN: 17 mg/dL (ref 6–24)
CO2: 21 mmol/L (ref 20–29)
Calcium: 9 mg/dL (ref 8.7–10.2)
Chloride: 104 mmol/L (ref 96–106)
Creatinine, Ser: 0.93 mg/dL (ref 0.76–1.27)
Glucose: 98 mg/dL (ref 70–99)
Potassium: 4.4 mmol/L (ref 3.5–5.2)
Sodium: 140 mmol/L (ref 134–144)
eGFR: 96 mL/min/1.73 (ref >59)

## 2023-10-01 ENCOUNTER — Telehealth: Payer: Self-pay | Admitting: *Deleted

## 2023-10-01 NOTE — Telephone Encounter (Signed)
 Cardiac Catheterization scheduled at [facility] for: Wednesday October 03, 2023 8:30 AM Arrival time [facility] Main Entrance A at: 6:30 AM  Diet: -Nothing to eat after midnight prior to procedure.  Hydration: -May drink clear liquids until leaving for hospital. Approved liquids: Water , clear tea, black coffee, fruit juices-non-citric and without pulp,Gatorade, plain Jello/popsicles.  Drink 16 oz. bottle of water  on the way to the hospital.   Medication instructions: -Usual morning medications can be taken including aspirin  81 mg.  Plan to go home the same day, you will only stay overnight if medically necessary.  You must have responsible adult to drive you home.  Someone must be with you the first 24 hours after you arrive home.  Reviewed procedure instructions with patient.

## 2023-10-02 NOTE — Progress Notes (Signed)
 Pt has been made aware of normal result and verbalized understanding.  jw

## 2023-10-03 ENCOUNTER — Other Ambulatory Visit: Payer: Self-pay

## 2023-10-03 ENCOUNTER — Ambulatory Visit (HOSPITAL_COMMUNITY)
Admission: RE | Admit: 2023-10-03 | Discharge: 2023-10-03 | Disposition: A | Discharge status: Home / Self Care | Attending: Cardiovascular Disease | Admitting: Cardiovascular Disease

## 2023-10-03 ENCOUNTER — Encounter (HOSPITAL_COMMUNITY)
Admission: RE | Disposition: A | Discharge status: Home / Self Care | Payer: Self-pay | Source: Home / Self Care | Attending: Cardiovascular Disease

## 2023-10-03 DIAGNOSIS — I25118 Atherosclerotic heart disease of native coronary artery with other forms of angina pectoris: Secondary | ICD-10-CM

## 2023-10-03 DIAGNOSIS — Z79899 Other long term (current) drug therapy: Secondary | ICD-10-CM | POA: Insufficient documentation

## 2023-10-03 DIAGNOSIS — Z955 Presence of coronary angioplasty implant and graft: Secondary | ICD-10-CM | POA: Insufficient documentation

## 2023-10-03 DIAGNOSIS — I252 Old myocardial infarction: Secondary | ICD-10-CM | POA: Insufficient documentation

## 2023-10-03 DIAGNOSIS — E785 Hyperlipidemia, unspecified: Secondary | ICD-10-CM | POA: Diagnosis not present

## 2023-10-03 DIAGNOSIS — Z7982 Long term (current) use of aspirin: Secondary | ICD-10-CM | POA: Insufficient documentation

## 2023-10-03 DIAGNOSIS — I25119 Atherosclerotic heart disease of native coronary artery with unspecified angina pectoris: Secondary | ICD-10-CM

## 2023-10-03 HISTORY — PX: LEFT HEART CATH AND CORONARY ANGIOGRAPHY: CATH118249

## 2023-10-03 SURGERY — LEFT HEART CATH AND CORONARY ANGIOGRAPHY
Anesthesia: LOCAL

## 2023-10-03 MED ORDER — ACETAMINOPHEN 325 MG PO TABS: 650.0000 mg | ORAL_TABLET | ORAL | Status: DC | PRN

## 2023-10-03 MED ORDER — VERAPAMIL HCL 2.5 MG/ML IV SOLN
INTRAVENOUS | Status: DC | PRN
  Administered 2023-10-03: 10 mL via INTRA_ARTERIAL

## 2023-10-03 MED ORDER — HEPARIN SODIUM (PORCINE) 1000 UNIT/ML IJ SOLN
INTRAMUSCULAR | Status: AC
  Filled 2023-10-03: qty 10

## 2023-10-03 MED ORDER — SODIUM CHLORIDE 0.9% FLUSH: 3.0000 mL | Freq: Two times a day (BID) | INTRAVENOUS | Status: DC

## 2023-10-03 MED ORDER — VERAPAMIL HCL 2.5 MG/ML IV SOLN
INTRAVENOUS | Status: AC
  Filled 2023-10-03: qty 2

## 2023-10-03 MED ORDER — FENTANYL CITRATE (PF) 100 MCG/2ML IJ SOLN
INTRAMUSCULAR | Status: AC
  Filled 2023-10-03: qty 2

## 2023-10-03 MED ORDER — MIDAZOLAM HCL 2 MG/2ML IJ SOLN
INTRAMUSCULAR | Status: AC
  Filled 2023-10-03: qty 2

## 2023-10-03 MED ORDER — ASPIRIN 81 MG PO CHEW: 81.0000 mg | CHEWABLE_TABLET | ORAL | Status: DC

## 2023-10-03 MED ORDER — LIDOCAINE HCL (PF) 1 % IJ SOLN
INTRAMUSCULAR | Status: AC
  Filled 2023-10-03: qty 30

## 2023-10-03 MED ORDER — SODIUM CHLORIDE 0.9 % IV SOLN
250.0000 mL | INTRAVENOUS | Status: DC | PRN
Start: 2023-10-03 — End: 2023-10-03

## 2023-10-03 MED ORDER — LIDOCAINE HCL (PF) 1 % IJ SOLN
INTRAMUSCULAR | Status: DC | PRN
  Administered 2023-10-03: 2 mL via INTRADERMAL

## 2023-10-03 MED ORDER — ONDANSETRON HCL 4 MG/2ML IJ SOLN: 4.0000 mg | Freq: Four times a day (QID) | INTRAMUSCULAR | Status: DC | PRN

## 2023-10-03 MED ORDER — IOHEXOL 350 MG/ML SOLN
INTRAVENOUS | Status: DC | PRN
  Administered 2023-10-03: 30 mL

## 2023-10-03 MED ORDER — FREE WATER: 500.0000 mL | Freq: Once | Status: DC

## 2023-10-03 MED ORDER — HEPARIN (PORCINE) IN NACL 1000-0.9 UT/500ML-% IV SOLN
INTRAVENOUS | Status: DC | PRN
  Administered 2023-10-03 (×2): 500 mL

## 2023-10-03 MED ORDER — SODIUM CHLORIDE 0.9 % IV SOLN: INTRAVENOUS | Status: DC

## 2023-10-03 MED ORDER — HYDRALAZINE HCL 20 MG/ML IJ SOLN: 10.0000 mg | INTRAMUSCULAR | Status: DC | PRN

## 2023-10-03 MED ORDER — HEPARIN SODIUM (PORCINE) 1000 UNIT/ML IJ SOLN
INTRAMUSCULAR | Status: DC | PRN
  Administered 2023-10-03: 4000 [IU] via INTRAVENOUS

## 2023-10-03 MED ORDER — LABETALOL HCL 5 MG/ML IV SOLN: 10.0000 mg | INTRAVENOUS | Status: DC | PRN

## 2023-10-03 MED ORDER — MIDAZOLAM HCL 2 MG/2ML IJ SOLN
INTRAMUSCULAR | Status: DC | PRN
Start: 2023-10-03 — End: 2023-10-03
  Administered 2023-10-03 (×2): 1 mg via INTRAVENOUS

## 2023-10-03 MED ORDER — FENTANYL CITRATE (PF) 100 MCG/2ML IJ SOLN
INTRAMUSCULAR | Status: DC | PRN
  Administered 2023-10-03 (×2): 25 ug via INTRAVENOUS

## 2023-10-03 MED ORDER — SODIUM CHLORIDE 0.9% FLUSH: 3.0000 mL | INTRAVENOUS | Status: DC | PRN

## 2023-10-03 SURGICAL SUPPLY — 7 items
CATH 5FR JL3.5 JR4 ANG PIG MP (CATHETERS) IMPLANT
DEVICE RAD COMP TR BAND LRG (VASCULAR PRODUCTS) IMPLANT
GLIDESHEATH SLEND SS 6F .021 (SHEATH) IMPLANT
GUIDEWIRE INQWIRE 1.5J.035X260 (WIRE) IMPLANT
KIT SYRINGE INJ CVI SPIKEX1 (MISCELLANEOUS) IMPLANT
PACK CARDIAC CATHETERIZATION (CUSTOM PROCEDURE TRAY) ×1 IMPLANT
SET ATX-X65L (MISCELLANEOUS) IMPLANT

## 2023-10-03 NOTE — Discharge Instructions (Addendum)

## 2023-10-03 NOTE — Progress Notes (Signed)
 TR band removed transparent dressing put in place. Pt ambulated in the hallway with Nurse, tolerated well not S/S of bleeding. Discharge instructions reviewed with patient and wife Tia, denies questions or concerns at this time.

## 2023-10-03 NOTE — Interval H&P Note (Signed)
 History and Physical Interval Note:  10/03/2023 8:20 AM  [name]  has presented today for surgery, with the diagnosis of chest pain, shortness of breath.  The various methods of treatment have been discussed with the patient and family. After consideration of risks, benefits and other options for treatment, the patient has consented to  Procedure(s): LEFT HEART CATH AND CORONARY ANGIOGRAPHY (N/A) as a surgical intervention.  The patient's history has been reviewed, patient examined, no change in status, stable for surgery.  I have reviewed the patient's chart and labs.  Questions were answered to the patient's satisfaction.    Cath Lab Visit (complete for each Cath Lab visit)  Clinical Evaluation Leading to the Procedure:   ACS: No.  Non-ACS:    Anginal Classification: CCS III  Anti-ischemic medical therapy: Maximal Therapy (2 or more classes of medications)  Non-Invasive Test Results: No non-invasive testing performed  Prior CABG: No previous CABG     Lonni Cash

## 2023-10-03 NOTE — Progress Notes (Signed)
 Pt escorted from the unit via wheelchair to personal vehicle.

## 2023-10-04 ENCOUNTER — Encounter (HOSPITAL_COMMUNITY): Payer: Self-pay | Admitting: Cardiovascular Disease

## 2023-10-05 ENCOUNTER — Telehealth: Payer: Self-pay | Admitting: *Deleted

## 2023-10-05 MED ORDER — RANOLAZINE ER 500 MG PO TB12: 500.0000 mg | ORAL_TABLET | Freq: Two times a day (BID) | ORAL | 3 refills | Status: AC

## 2023-10-05 NOTE — Telephone Encounter (Signed)
-----   Message from Lonni Cash sent at 10/03/2023  9:21 AM EDT ----- Can we send in Ranexa  500 mg po BID to [facility]? Thanks,chris

## 2023-10-15 ENCOUNTER — Encounter: Payer: Self-pay | Admitting: *Deleted

## 2023-10-15 NOTE — Assessment & Plan Note (Signed)
 History of inferior STEMI in October 2021 treated with DES to mid RCA and staged PCI with DES to mid LAD. Nuclear stress test in February 2024 showed inferior and inferoseptal infarct with no ischemia.  Recent cardiac cath showed patent LAD and RCA stents and nonobstructive disease in the LCx. He was started on ranolazine .  He just started this 2 days ago.  He has not really noticed much improvement yet.  He also notes shortness of breath with exertion.  He has a significant smoking history.  We discussed his cardiac catheterization today and I reassured him he does not have obstructive coronary artery disease.  I suspect, given his symptoms, he has microvascular disease. -Continue ASA 81 mg daily, atorvastatin  80 mg daily, isosorbide  mononitrate 15 mg daily, metoprolol  25 mg twice daily, nitroglycerin  as needed, ranolazine  500 mg twice daily -If symptoms do not improve, consider addition of ACE/ARB.  We would need to likely stop his nitrates to allow enough blood pressure to start this. -If shortness of breath continues, I have asked him to reach out so that I can refer him to pulmonology. -Follow-up 6 months

## 2023-10-15 NOTE — Progress Notes (Unsigned)
 OFFICE NOTE:    Date:  10/16/2023  ID:  [name], DOB [birth year], MRN [id number] PCP: Patient, No Pcp Per  [facility] Providers Cardiologist:  Lonni Cash, MD        Coronary artery disease  Inferior STEMI 12/25/2019 s/p 3 x 16 mm DES to the mid RCA Staged PCI 12/26/2019: Mid LAD 80, proximal LCx 20, mid RCA stent patent; s/p 2.5 x 20 mm DES to the mid LAD TTE 12/26/2019: EF 55-60, no RWMA, normal RVSF, trivial MR, RAP 3 ETT 06/14/20: normal  Myoview  04/07/2022: Inferior and inferoseptal infarct, no ischemia, normal EF, low risk LHC 10/03/23: LAD stent patent, RCA stent patent, mLCx 30 PVCs, PACs, SVT Monitor 01/27/2020: 4 beat run SVT, rare PACs, rare PVCs, NSR Hyperlipidemia        Discussed the use of AI scribe software for clinical note transcription with the patient, who gave verbal consent to proceed. History of Present Illness Marvin Velasquez is a 58 y.o. male who returns for follow up after a recent cardiac catheterization. He had continued symptoms of angina despite multiple antianginal medications. Cardiac catheterization showed patent LAD and RCA stents and mild nonobstructive disease in the LCx. Ranolazine  was added to his medical regimen.    He continues to note chest discomfort with radiation to bilateral arms with exertion.  This typically occurs after prolonged period of rest.  The more active he is the better his symptoms are.  He also experiences shortness of breath, particularly when walking uphill, which is a newer symptom. The shortness of breath is not associated with arm pain and typically occurs at night. He initially found isosorbide  helpful but experienced adverse effects when he increased the dose, so he returned to taking half a pill (15 mg). He has no leg swelling, cough, or wheezing, and he breathes comfortably when lying flat. He has a history of smoking, having quit approximately 15 years ago after smoking up to two packs a day.     ROS-See HPI    Studies Reviewed:  EKG Interpretation Date/Time:  Tuesday October 16 2023 08:41:24 EDT Ventricular Rate:  67 PR Interval:  136 QRS Duration:  80 QT Interval:  370 QTC Calculation: 390 R Axis:   19  Text Interpretation: Normal sinus rhythm Normal ECG Confirmed by Lelon Hamilton [id number] on 10/16/2023 8:50:50 AM    Labs 09/26/23: TC 128, HDL 42, Tg 120, LDL 64        Physical Exam:  VS:  BP 100/74   Pulse 67   Ht 5' 6 (1.676 m)   Wt 168 lb 3.2 oz (76.3 kg)   SpO2 97%   BMI 27.15 kg/m        Wt Readings from Last 3 Encounters:  10/16/23 168 lb 3.2 oz (76.3 kg)  10/03/23 165 lb (74.8 kg)  09/24/23 167 lb 3.2 oz (75.8 kg)    Constitutional:      Appearance: Healthy appearance. Not in distress.  Neck:     Vascular: JVD normal.  Pulmonary:     Breath sounds: Normal breath sounds. No wheezing. No rales.  Cardiovascular:     Normal rate. Regular rhythm.     Murmurs: There is no murmur.  Edema:    Peripheral edema absent.  Abdominal:     Palpations: Abdomen is soft.       Assessment and Plan:    Assessment & Plan Coronary artery disease involving native coronary artery of native heart with angina pectoris (  HCC) History of inferior STEMI in October 2021 treated with DES to mid RCA and staged PCI with DES to mid LAD. Nuclear stress test in February 2024 showed inferior and inferoseptal infarct with no ischemia.  Recent cardiac cath showed patent LAD and RCA stents and nonobstructive disease in the LCx. He was started on ranolazine .  He just started this 2 days ago.  He has not really noticed much improvement yet.  He also notes shortness of breath with exertion.  He has a significant smoking history.  We discussed his cardiac catheterization today and I reassured him he does not have obstructive coronary artery disease.  I suspect, given his symptoms, he has microvascular disease. -Continue ASA 81 mg daily, atorvastatin  80 mg daily, isosorbide  mononitrate 15 mg  daily, metoprolol  25 mg twice daily, nitroglycerin  as needed, ranolazine  500 mg twice daily -If symptoms do not improve, consider addition of ACE/ARB.  We would need to likely stop his nitrates to allow enough blood pressure to start this. -If shortness of breath continues, I have asked him to reach out so that I can refer him to pulmonology. -Follow-up 6 months Hyperlipidemia LDL goal <70 Recent LDL optimal.  Continue atorvastatin  80 mg daily. Shortness of breath He has a significant smoking history.  Quit about 15 years ago.  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  COPD may explain his symptoms.  Recent cardiac cath with normal LVEDP.  No signs or symptoms of congestive heart failure. - As noted if shortness of breath continues, refer to pulmonology        Dispo:  Return in about 6 months (around 04/17/2024) for Routine Follow Up, w/ Dr. Verlin.  Signed, Glendia Ferrier, PA-C

## 2023-10-16 ENCOUNTER — Ambulatory Visit: Attending: Physician Assistant | Admitting: Physician Assistant

## 2023-10-16 ENCOUNTER — Encounter: Payer: Self-pay | Admitting: Physician Assistant

## 2023-10-16 VITALS — BP 100/74 | HR 67 | Ht 66.0 in | Wt 168.2 lb

## 2023-10-16 DIAGNOSIS — E785 Hyperlipidemia, unspecified: Secondary | ICD-10-CM | POA: Diagnosis not present

## 2023-10-16 DIAGNOSIS — R0602 Shortness of breath: Secondary | ICD-10-CM

## 2023-10-16 DIAGNOSIS — I25119 Atherosclerotic heart disease of native coronary artery with unspecified angina pectoris: Secondary | ICD-10-CM

## 2023-10-16 NOTE — Assessment & Plan Note (Signed)
Recent LDL optimal.  Continue atorvastatin 80 mg daily.

## 2023-10-16 NOTE — Patient Instructions (Signed)
 Medication Instructions:  Your physician recommends that you continue on your current medications as directed. Please refer to the Current Medication list given to you today.  *If you need a refill on your cardiac medications before your next appointment, please call your pharmacy*  Lab Work: None ordered  If you have labs (blood work) drawn today and your tests are completely normal, you will receive your results only by: MyChart Message (if you have MyChart) OR A paper copy in the mail If you have any lab test that is abnormal or we need to change your treatment, we will call you to review the results.  Testing/Procedures: None ordered  Follow-Up: At [facility], you and your health needs are our priority.  As part of our continuing mission to provide you with exceptional heart care, our providers are all part of one team.  This team includes your primary Cardiologist (physician) and Advanced Practice Providers or APPs (Physician Assistants and Nurse Practitioners) who all work together to provide you with the care you need, when you need it.  Your next appointment:   6 month(s)  Provider:   Lonni Cash, MD    We recommend signing up for the patient portal called MyChart.  Sign up information is provided on this After Visit Summary.  MyChart is used to connect with patients for Virtual Visits (Telemedicine).  Patients are able to view lab/test results, encounter notes, upcoming appointments, etc.  Non-urgent messages can be sent to your provider as well.   To learn more about what you can do with MyChart, go to [email or web address].   Other Instructions If your shortness of breath doesn't get better in a couple of weeks, call us .

## 2023-10-30 DIAGNOSIS — M2041 Other hammer toe(s) (acquired), right foot: Secondary | ICD-10-CM | POA: Insufficient documentation

## 2023-10-30 DIAGNOSIS — M2042 Other hammer toe(s) (acquired), left foot: Secondary | ICD-10-CM | POA: Insufficient documentation

## 2023-11-02 ENCOUNTER — Encounter: Payer: Self-pay | Admitting: Student in an Organized Health Care Education/Training Program

## 2023-11-02 ENCOUNTER — Ambulatory Visit (INDEPENDENT_AMBULATORY_CARE_PROVIDER_SITE_OTHER): Admitting: Student in an Organized Health Care Education/Training Program

## 2023-11-02 VITALS — BP 136/78 | HR 70 | Ht 67.0 in | Wt 167.0 lb

## 2023-11-02 DIAGNOSIS — Z1159 Encounter for screening for other viral diseases: Secondary | ICD-10-CM

## 2023-11-02 DIAGNOSIS — E538 Deficiency of other specified B group vitamins: Secondary | ICD-10-CM | POA: Insufficient documentation

## 2023-11-02 DIAGNOSIS — Z131 Encounter for screening for diabetes mellitus: Secondary | ICD-10-CM

## 2023-11-02 DIAGNOSIS — Z114 Encounter for screening for human immunodeficiency virus [HIV]: Secondary | ICD-10-CM | POA: Diagnosis not present

## 2023-11-02 DIAGNOSIS — R399 Unspecified symptoms and signs involving the genitourinary system: Secondary | ICD-10-CM | POA: Diagnosis not present

## 2023-11-02 DIAGNOSIS — I25119 Atherosclerotic heart disease of native coronary artery with unspecified angina pectoris: Secondary | ICD-10-CM | POA: Diagnosis not present

## 2023-11-02 DIAGNOSIS — Z0000 Encounter for general adult medical examination without abnormal findings: Secondary | ICD-10-CM | POA: Diagnosis not present

## 2023-11-02 DIAGNOSIS — R0609 Other forms of dyspnea: Secondary | ICD-10-CM | POA: Insufficient documentation

## 2023-11-02 LAB — VITAMIN B12: Vitamin B-12: 388 pg/mL (ref 211–911)

## 2023-11-02 LAB — TSH: TSH: 2.58 u[IU]/mL (ref 0.35–5.50)

## 2023-11-02 LAB — PSA: PSA: 1.12 ng/mL (ref 0.10–4.00)

## 2023-11-02 LAB — HEMOGLOBIN A1C: Hgb A1c MFr Bld: 6 % (ref 4.6–6.5)

## 2023-11-02 NOTE — Patient Instructions (Signed)
  VISIT SUMMARY: During your visit, we discussed your ongoing chest pain and shortness of breath, your history of colon cancer, and other health concerns. We reviewed your current medications and made plans for further evaluations and follow-ups.  YOUR PLAN: -CORONARY ARTERY DISEASE WITH PRIOR INFERIOR WALL STEMI, DRUG-ELUTING STENTS, AND CHRONIC STABLE ANGINA: You have a history of a heart attack and received stents to keep your arteries open. Your chest pain and fatigue are being managed with your current medications, which you should continue taking as prescribed.  -EXERTIONAL DYSPNEA UNDER EVALUATION: You experience shortness of breath during physical activity. This could be related to your past smoking. We will perform lung function tests to understand this better.  -HISTORY OF COLON CANCER STATUS POST POLYPECTOMY: You had colon cancer in the past, and your last colonoscopy was normal. We will schedule your next colonoscopy in four years to continue monitoring your health.  -LOWER URINARY TRACT SYMPTOMS (NOCTURIA): You wake up 2-3 times at night to urinate, which may be due to an enlarged prostate. We will do a PSA test to check for prostate issues.  -FATIGUE UNDER EVALUATION: You feel tired and lack energy. We will check your thyroid  function and vitamin B12 levels to find out if they are causing your fatigue.  -BENIGN SKIN LESION (SUN SPOT): You have a non-cancerous sun spot on your skin. We will keep an eye on it for any changes.  -GENERAL HEALTH MAINTENANCE: We discussed the importance of regular health screenings, a healthy diet, and regular exercise. We will also check your blood sugar levels.  INSTRUCTIONS: Please continue taking your medications as prescribed. We will schedule lung function tests and a PSA test. Additionally, we will order blood tests to check your thyroid  function, vitamin B12 levels, and blood sugar levels. Your next colonoscopy is due in four years. Maintain a  healthy diet and regular exercise. Follow up with us  if you notice any changes in your symptoms or have any concerns.

## 2023-11-02 NOTE — Progress Notes (Signed)
 Complete physical exam  Patient: Marvin Velasquez   DOB: 1965-07-27   58 y.o. Male  MRN: [id number]  Subjective:    Chief Complaint  Patient presents with   Establish Care    Some concerns to go over     KEEAN Velasquez is a 58 y.o. male who presents today for a complete physical exam. He reports consuming a general diet.  He generally feels well. He reports sleeping well. He does have additional problems to discuss today.   Discussed the use of AI scribe software for clinical note transcription with the patient, who gave verbal consent to proceed.  History of Present Illness [name] is a 58 year old male with coronary artery disease and prior myocardial infarction who presents with chest pain and shortness of breath.  He experiences chest pain and shortness of breath, particularly after physical exertion such as walking or lifting. The chest pain is sometimes accompanied by arm pain and typically resolves with rest. He had a myocardial infarction in October 2021, identified as an inferior wall STEMI, and received drug-eluting stents in the mid RCA and mid LEV. A stress test in February 2024 was unremarkable. He is currently on Imdur  daily, metoprolol  twice a day, ranolazine  twice a day, and nitroglycerin  as needed, though he rarely uses nitroglycerin .  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 He can walk on a treadmill for 15-20 minutes without significant shortness of breath. He quit smoking 15 years ago after a 25-year history of smoking two packs per day. No history of asthma, morning cough, or sputum production.  He has a history of possible colon cancer with cancerous polyps removed several years ago, requiring follow-up colonoscopies every five years. His last colonoscopy in 2024 was normal. He reports occasional tiredness and a lack of energy. He sleeps about seven hours per night and denies snoring or sleep  disturbances.  He is married with two adult children, aged 37 and 83, who live at home. He works full-time as an Journalist, newspaper and maintains a [employer]. He uses a pillbox organized by his wife to manage his medications. He has no significant family history of prostate issues and denies any mood problems, though he describes himself as a 'quiet guy.'  Most recent fall risk assessment:    11/02/2023    8:07 AM  Fall Risk   Falls in the past year? 0  Number falls in past yr: 0  Injury with Fall? 0  Risk for fall due to : No Fall Risks  Follow up Falls evaluation completed     Past Medical History:  Diagnosis Date   Back pain    CAD (coronary artery disease)    a. inferior STEMI 11/2019 s/p DES to RCA with staged PCI/DES to LAD, normal LVEF.   Premature atrial contractions    PVC's (premature ventricular contractions)    SVT (supraventricular tachycardia) [facility])       Patient Care Team: Jerrell Cleatus Ned, MD as PCP - General (Internal Medicine) Verlin Lonni BIRCH, MD as PCP - Cardiology (Cardiology)   Outpatient Medications Prior to Visit  Medication Sig   aspirin  EC 81 MG tablet Take 81 mg by mouth daily. Swallow whole.   atorvastatin  (LIPITOR ) 80 MG tablet Take 1 tablet (80 mg total) by mouth daily at 6 PM.   isosorbide  mononitrate (IMDUR ) 30 MG 24 hr tablet Take 1 tablet (30  mg total) by mouth daily.   Krill Oil 500 MG CAPS Take 500 mg by mouth daily.   meloxicam (MOBIC) 7.5 MG tablet Take 7.5 mg by mouth as needed.   metoprolol  tartrate (LOPRESSOR ) 25 MG tablet TAKE 1 TABLET BY MOUTH 2 TIMES DAILY   Multiple Vitamins-Minerals (MULTIVITAMIN ADULTS 50+ PO) Take 1 tablet by mouth daily.   nitroGLYCERIN  (NITROSTAT ) 0.4 MG SL tablet PLACE 1 TABLET UNDER TONGUE EVERY 5 MINUTES AS NEEDED FOR CHEST PAIN. IF 3 DOSES ARE NECESSARY CALL 911.   ranolazine  (RANEXA ) 500 MG 12 hr tablet Take 1 tablet (500 mg total) by mouth 2 (two) times daily.   No facility-administered  medications prior to visit.       Objective:     BP 136/78   Pulse 70   Ht 5' 7 (1.702 m)   Wt 167 lb (75.8 kg)   BMI 26.16 kg/m   Physical Exam   Gen: Well-appearing Eyes: Normal Ears: Bilateral tympanic membranes are nonerythematous, no light reflex, some chronic scarring, cannot make out landmarks, no effusion Mouth: Normal oropharynx Neck: Normal thyroid , no nodules or adenopathy Heart: Regular, no murmur Lungs: Unlabored, clear throughout, no crackles or wheezing Abd: Soft, nontender, no organomegaly, no hernias Ext: Warm, no edema, normal joints Neuro: Alert, conversational, full strength upper and lower extremities, normal gait and balance Psych: Appropriate mood and affect, quiet man, answers questions directly, appears a little anxious, but he denies these feelings     Assessment & Plan:    Routine Health Maintenance and Physical Exam  Immunization History  Administered Date(s) Administered   Influenza,inj,Quad PF,6+ Mos 12/27/2019   Influenza-Unspecified 01/26/2018   PFIZER(Purple Top)SARS-COV-2 Vaccination 05/02/2019, 05/28/2019   Tdap 01/30/2023    Health Maintenance  Topic Date Due   HIV Screening  Never done   Hepatitis C Screening  Never done   Pneumococcal Vaccine: 50+ Years (1 of 2 - PCV) Never done   Hepatitis B Vaccines 19-59 Average Risk (1 of 3 - 19+ 3-dose series) Never done   Lung Cancer Screening  Never done   Zoster Vaccines- Shingrix (1 of 2) Never done   Influenza Vaccine  09/28/2023   COVID-19 Vaccine (3 - 2025-26 season) 10/29/2023   Colonoscopy  04/25/2027   DTaP/Tdap/Td (2 - Td or Tdap) 01/29/2033   HPV VACCINES  Aged Out   Meningococcal B Vaccine  Aged Out    Discussed health benefits of physical activity, and encouraged him to engage in regular exercise appropriate for his age and condition.  Problem List Items Addressed This Visit       High   Coronary artery disease involving native coronary artery with angina pectoris  (HCC) (Chronic)   He experienced an inferior wall STEMI in October 2021, treated with drug-eluting stents in the mid RCA and mid LAD. A recent catheterization in August showed non-obstructive disease in the left circumflex and patent stents. He reports intermittent chest pain and exertional fatigue, which resolves with rest and does not require nitroglycerin . Blood pressure is 136/78, and LDL is 64 mg/dL. Continue Imdur , metoprolol , ranolazine , aspirin  and atorvastatin .       Relevant Orders   TSH     Medium    Lower urinary tract symptoms (LUTS) (Chronic)   He experiences nocturia 2-3 times per night, possibly due to prostate enlargement. Ordered a PSA test to evaluate prostate cancer risk.      Relevant Orders   PSA     Low   Health maintenance  examination - Primary (Chronic)   Overall doing pretty well.  His ischemic coronary artery disease is certainly his biggest health issue, currently being managed well with medications.  I need to obtain records about his prior colonoscopies, unclear to me if the polyps that were removed were cancerous or precancerous.  We talked about prostate cancer screening today and we will check a PSA.  Significant tobacco use history, but does not qualify for lung cancer screening because he quit smoking over 15 years ago.  We offered him influenza and pneumococcal vaccinations today.  No issues with mood or substance use.  Overall doing well with his nutrition and exercise life.        Unprioritized   B12 deficiency   Increasing fatigue recently, CBC has a slight macrocytosis, I suspect you may have a B12 deficiency.  Will check a B12 level.      Relevant Orders   Vitamin B12   Dyspnea on exertion   He experiences exertional dyspnea of varying severity. There is no history of asthma. He quit smoking 15 years ago after a 25-year history of smoking two packs per day, which increases his risk for COPD or emphysema. Order lung function tests for evaluation.       Relevant Orders   Pulmonary function test   Other Visit Diagnoses       Screening for HIV (human immunodeficiency virus)       Relevant Orders   HIV Antibody (routine testing w rflx)     Encounter for HCV screening test for low risk patient       Relevant Orders   Hepatitis C antibody     Screening for diabetes mellitus       Relevant Orders   Hemoglobin A1c      Return in about 6 months (around 05/01/2024).     Cleatus Debby Specking, MD

## 2023-11-02 NOTE — Assessment & Plan Note (Signed)
 He experienced an inferior wall STEMI in October 2021, treated with drug-eluting stents in the mid RCA and mid LAD. A recent catheterization in August showed non-obstructive disease in the left circumflex and patent stents. He reports intermittent chest pain and exertional fatigue, which resolves with rest and does not require nitroglycerin . Blood pressure is 136/78, and LDL is 64 mg/dL. Continue Imdur , metoprolol , ranolazine , aspirin  and atorvastatin .

## 2023-11-02 NOTE — Assessment & Plan Note (Signed)
 Overall doing pretty well.  His ischemic coronary artery disease is certainly his biggest health issue, currently being managed well with medications.  I need to obtain records about his prior colonoscopies, unclear to me if the polyps that were removed were cancerous or precancerous.  We talked about prostate cancer screening today and we will check a PSA.  Significant tobacco use history, but does not qualify for lung cancer screening because he quit smoking over 15 years ago.  We offered him influenza and pneumococcal vaccinations today.  No issues with mood or substance use.  Overall doing well with his nutrition and exercise life.

## 2023-11-02 NOTE — Assessment & Plan Note (Signed)
 He experiences nocturia 2-3 times per night, possibly due to prostate enlargement. Ordered a PSA test to evaluate prostate cancer risk.

## 2023-11-02 NOTE — Assessment & Plan Note (Signed)
 Increasing fatigue recently, CBC has a slight macrocytosis, I suspect you may have a B12 deficiency.  Will check a B12 level.

## 2023-11-02 NOTE — Assessment & Plan Note (Signed)
 He experiences exertional dyspnea of varying severity. There is no history of asthma. He quit smoking 15 years ago after a 25-year history of smoking two packs per day, which increases his risk for COPD or emphysema. Order lung function tests for evaluation.

## 2023-11-03 LAB — HIV ANTIBODY (ROUTINE TESTING W REFLEX): HIV 1&2 Ab, 4th Generation: NONREACTIVE

## 2023-11-03 LAB — HEPATITIS C ANTIBODY: Hepatitis C Ab: NONREACTIVE

## 2023-11-05 ENCOUNTER — Ambulatory Visit: Payer: Self-pay | Admitting: Student in an Organized Health Care Education/Training Program

## 2023-11-05 DIAGNOSIS — R7303 Prediabetes: Secondary | ICD-10-CM | POA: Insufficient documentation

## 2024-05-01 ENCOUNTER — Ambulatory Visit: Admitting: Student in an Organized Health Care Education/Training Program

## 2024-05-15 ENCOUNTER — Ambulatory Visit: Admitting: Cardiovascular Disease
